# Patient Record
Sex: Female | Born: 1995 | Race: White | Hispanic: No | Marital: Single | Smoking: Never smoker
Health system: Southern US, Community
[De-identification: ages and names within clinical notes are randomized; demographics above are authoritative.]

## PROBLEM LIST (undated history)

## (undated) DIAGNOSIS — F419 Anxiety disorder, unspecified: Secondary | ICD-10-CM

## (undated) DIAGNOSIS — G4700 Insomnia, unspecified: Secondary | ICD-10-CM

## (undated) DIAGNOSIS — F319 Bipolar disorder, unspecified: Secondary | ICD-10-CM

## (undated) DIAGNOSIS — O009 Unspecified ectopic pregnancy without intrauterine pregnancy: Secondary | ICD-10-CM

## (undated) DIAGNOSIS — F32A Depression, unspecified: Secondary | ICD-10-CM

## (undated) DIAGNOSIS — J45909 Unspecified asthma, uncomplicated: Secondary | ICD-10-CM

## (undated) HISTORY — PX: OTHER SURGICAL HISTORY: SHX169

---

## 2019-03-28 ENCOUNTER — Other Ambulatory Visit: Payer: Self-pay

## 2019-03-28 ENCOUNTER — Emergency Department (HOSPITAL_COMMUNITY)
Admission: EM | Admit: 2019-03-28 | Discharge: 2019-03-28 | Disposition: A | Discharge status: Home / Self Care | Payer: Self-pay | Attending: Emergency Medicine | Admitting: Emergency Medicine

## 2019-03-28 ENCOUNTER — Encounter (HOSPITAL_COMMUNITY): Payer: Self-pay

## 2019-03-28 DIAGNOSIS — N390 Urinary tract infection, site not specified: Secondary | ICD-10-CM | POA: Insufficient documentation

## 2019-03-28 DIAGNOSIS — N939 Abnormal uterine and vaginal bleeding, unspecified: Secondary | ICD-10-CM | POA: Insufficient documentation

## 2019-03-28 HISTORY — DX: Unspecified ectopic pregnancy without intrauterine pregnancy: O00.90

## 2019-03-28 LAB — COMPREHENSIVE METABOLIC PANEL
ALT: 12 U/L (ref 0–44)
AST: 15 U/L (ref 15–41)
Albumin: 4.1 g/dL (ref 3.5–5.0)
Alkaline Phosphatase: 43 U/L (ref 38–126)
Anion gap: 6 (ref 5–15)
BUN: 7 mg/dL (ref 6–20)
CO2: 26 mmol/L (ref 22–32)
Calcium: 9.1 mg/dL (ref 8.9–10.3)
Chloride: 106 mmol/L (ref 98–111)
Creatinine, Ser: 0.79 mg/dL (ref 0.44–1.00)
GFR calc Af Amer: >60 mL/min (ref >60)
GFR calc non Af Amer: >60 mL/min (ref >60)
Glucose, Bld: 92 mg/dL (ref 70–99)
Potassium: 3.7 mmol/L (ref 3.5–5.1)
Sodium: 138 mmol/L (ref 135–145)
Total Bilirubin: 1.4 mg/dL — ABNORMAL HIGH (ref 0.3–1.2)
Total Protein: 6.3 g/dL — ABNORMAL LOW (ref 6.5–8.1)

## 2019-03-28 LAB — URINALYSIS, ROUTINE W REFLEX MICROSCOPIC
Bilirubin Urine: NEGATIVE
Glucose, UA: NEGATIVE mg/dL
Ketones, ur: 15 mg/dL — AB
Leukocytes,Ua: NEGATIVE
Nitrite: POSITIVE — AB
Protein, ur: 100 mg/dL — AB
Specific Gravity, Urine: >1.03 — ABNORMAL HIGH (ref 1.005–1.030)
pH: 5 (ref 5.0–8.0)

## 2019-03-28 LAB — I-STAT BETA HCG BLOOD, ED (MC, WL, AP ONLY): I-stat hCG, quantitative: <5 m[IU]/mL (ref <5)

## 2019-03-28 LAB — WET PREP, GENITAL
Sperm: NONE SEEN
Trich, Wet Prep: NONE SEEN
WBC, Wet Prep HPF POC: NONE SEEN
Yeast Wet Prep HPF POC: NONE SEEN

## 2019-03-28 LAB — URINALYSIS, MICROSCOPIC (REFLEX): RBC / HPF: >50 RBC/hpf (ref 0–5)

## 2019-03-28 LAB — CBC
HCT: 39 % (ref 36.0–46.0)
Hemoglobin: 13.3 g/dL (ref 12.0–15.0)
MCH: 34.2 pg — ABNORMAL HIGH (ref 26.0–34.0)
MCHC: 34.1 g/dL (ref 30.0–36.0)
MCV: 100.3 fL — ABNORMAL HIGH (ref 80.0–100.0)
Platelets: 249 10*3/uL (ref 150–400)
RBC: 3.89 MIL/uL (ref 3.87–5.11)
RDW: 11.2 % — ABNORMAL LOW (ref 11.5–15.5)
WBC: 4.9 10*3/uL (ref 4.0–10.5)
nRBC: 0 % (ref 0.0–0.2)

## 2019-03-28 LAB — HIV ANTIBODY (ROUTINE TESTING W REFLEX): HIV Screen 4th Generation wRfx: NONREACTIVE

## 2019-03-28 LAB — LIPASE, BLOOD: Lipase: 30 U/L (ref 11–51)

## 2019-03-28 MED ORDER — CEPHALEXIN 500 MG PO CAPS: ORAL_CAPSULE | ORAL | 0 refills | Status: DC

## 2019-03-28 NOTE — ED Notes (Signed)
Blood draw attempted for 3 gold tubes, vein blew after 1 full tube. 2 partial also sent before next site blew.

## 2019-03-28 NOTE — ED Triage Notes (Signed)
Pt arrives POV for eval of vag bleeding onset this AM. Reports she has taken 2 negative pregnancy tests but endorses new onset vag bleeding this AM in addition to a normal period this month. States she has a hx of ectopic pregnancy w/ tube rupture. States this does not feel similar but she "wants to get check out" Endorses mild nonspecific abd pain.

## 2019-03-28 NOTE — ED Provider Notes (Signed)
[facility] EMERGENCY DEPARTMENT Provider Note   CSN: [id number] Arrival date & time: 03/28/19  1206     History   Chief Complaint Chief Complaint  Patient presents with  . Vaginal Bleeding    HPI Janet Chen is a 23 y.o. female.     The history is provided by the patient. No language interpreter was used.     23 year old female with prior history of ectopic pregnancy presenting for evaluation of vaginal bleeding.  Patient report earlier this week she developed vaginal bleeding for approximately 3 days.  Bleeding was scant, and subsequently stopped however bleeding resumed again today.  She endorsed some mild lower abdominal cramping with the bleeding and was concerned for potential ectopic pregnancy.  States that she has had prior ectopic pregnancy.  She also took 2 pregnancy test of which are negative.  She does endorse new sexual partner, using protection.  She denies any prior history of STI.  She does not complain of any fever chills no nausea vomiting or diarrhea no dysuria or hematuria.  She report her pain is minimal, sharp, 5 out of 10, gradual and nonradiating.  Past Medical History:  Diagnosis Date  . Ectopic pregnancy     There are no active problems to display for this patient.   Past Surgical History:  Procedure Laterality Date  . Fallopian Tube Removal       OB History    Gravida  0   Para  0   Term  0   Preterm  0   AB  0   Living  0     SAB  0   TAB  0   Ectopic  0   Multiple  0   Live Births  0            Home Medications    Prior to Admission medications   Not on File    Family History No family history on file.  Social History Social History   Tobacco Use  . Smoking status: Never Smoker  . Smokeless tobacco: Never Used  Substance Use Topics  . Alcohol use: Not Currently  . Drug use: Not Currently     Allergies   Patient has no known allergies.   Review of Systems Review of  Systems  All other systems reviewed and are negative.    Physical Exam Updated Vital Signs BP 115/82 (BP Location: Left Arm)   Pulse 78   Temp 98.7 F (37.1 C) (Oral)   Resp 14   Ht 5\' 5"  (1.651 m)   Wt 57.6 kg   SpO2 100%   BMI 21.13 kg/m   Physical Exam Vitals signs and nursing note reviewed.  Constitutional:      General: She is not in acute distress.    Appearance: She is well-developed.  HENT:     Head: Atraumatic.  Eyes:     Conjunctiva/sclera: Conjunctivae normal.  Neck:     Musculoskeletal: Neck supple.  Cardiovascular:     Rate and Rhythm: Normal rate and regular rhythm.     Pulses: Normal pulses.     Heart sounds: Normal heart sounds.  Pulmonary:     Effort: Pulmonary effort is normal.     Breath sounds: Normal breath sounds.  Abdominal:     General: Abdomen is flat.     Palpations: Abdomen is soft.     Tenderness: There is no abdominal tenderness.  Genitourinary:    Comments: Chaperone present during exam.  No inguinal lymphadenopathy or inguinal hernia noted.  Normal external genitalia.  Small amount of blood noted in vaginal vault.  Cervical os is closed without any friable skin changes.  No obvious discharge noted.  On bimanual examination no adnexal tenderness or cervical motion tenderness. Skin:    Findings: No rash.  Neurological:     Mental Status: She is alert.      ED Treatments / Results  Labs (all labs ordered are listed, but only abnormal results are displayed) Labs Reviewed  WET PREP, GENITAL - Abnormal; Notable for the following components:      Result Value   Clue Cells Wet Prep HPF POC PRESENT (*)    All other components within normal limits  COMPREHENSIVE METABOLIC PANEL - Abnormal; Notable for the following components:   Total Protein 6.3 (*)    Total Bilirubin 1.4 (*)    All other components within normal limits  CBC - Abnormal; Notable for the following components:   MCV 100.3 (*)    MCH 34.2 (*)    RDW 11.2 (*)    All  other components within normal limits  URINALYSIS, ROUTINE W REFLEX MICROSCOPIC - Abnormal; Notable for the following components:   Color, Urine RED (*)    APPearance CLOUDY (*)    Specific Gravity, Urine >1.030 (*)    Hgb urine dipstick LARGE (*)    Ketones, ur 15 (*)    Protein, ur 100 (*)    Nitrite POSITIVE (*)    All other components within normal limits  URINALYSIS, MICROSCOPIC (REFLEX) - Abnormal; Notable for the following components:   Bacteria, UA MANY (*)    All other components within normal limits  LIPASE, BLOOD  RPR  HIV ANTIBODY (ROUTINE TESTING W REFLEX)  I-STAT BETA HCG BLOOD, ED ([facility], [facility], [facility] ONLY)  GC/CHLAMYDIA PROBE AMP ([place]) NOT AT [facility]    EKG None  Radiology No results found.  Procedures Procedures (including critical care time)  Medications Ordered in ED Medications - No data to display   Initial Impression / Assessment and Plan / ED Course  I have reviewed the triage vital signs and the nursing notes.  Pertinent labs & imaging results that were available during my care of the patient were reviewed by me and considered in my medical decision making (see chart for details).        BP 115/82 (BP Location: Left Arm)   Pulse 78   Temp 98.7 F (37.1 C) (Oral)   Resp 14   Ht 5\' 5"  (1.651 m)   Wt 57.6 kg   SpO2 100%   BMI 21.13 kg/m    Final Clinical Impressions(s) / ED Diagnoses   Final diagnoses:  Abnormal vaginal bleeding  Acute lower UTI    ED Discharge Orders    None     1:42 PM Patient with prior history of ectopic pregnancy and complication here with vaginal bleeding and concern for pregnancy.  She has a benign abdominal exam, pelvic semination unremarkable.  No evidence to suggest PID.  2:20 PM Pregnancy test is negative.  UA does show positive nitrite and many bacteria concerning for urinary tract infection.  Will treat with Keflex.  Presence of clue cells however patient does not have odor or vaginal irritation to  suggest BV.  The remainder of her labs are reassuring.  Will discharge home with antibiotic as treatment for urinary tract infection.  Return precaution discussed.   Fayrene Helperran, Cerissa Zeiger, PA-C 03/28/19 1504  Carmin Muskrat, MD 03/28/19 [phone]

## 2019-03-29 LAB — RPR: RPR Ser Ql: NONREACTIVE

## 2019-03-30 LAB — GC/CHLAMYDIA PROBE AMP (~~LOC~~) NOT AT ARMC
Chlamydia: NEGATIVE
Neisseria Gonorrhea: NEGATIVE

## 2019-08-25 ENCOUNTER — Other Ambulatory Visit: Payer: Self-pay

## 2019-08-25 ENCOUNTER — Emergency Department (HOSPITAL_COMMUNITY)
Admission: EM | Admit: 2019-08-25 | Discharge: 2019-08-26 | Disposition: A | Discharge status: Home / Self Care | Payer: BLUE CROSS/BLUE SHIELD | Attending: Emergency Medicine | Admitting: Emergency Medicine

## 2019-08-25 ENCOUNTER — Encounter (HOSPITAL_COMMUNITY): Payer: Self-pay | Admitting: *Deleted

## 2019-08-25 DIAGNOSIS — F329 Major depressive disorder, single episode, unspecified: Secondary | ICD-10-CM | POA: Insufficient documentation

## 2019-08-25 DIAGNOSIS — F419 Anxiety disorder, unspecified: Secondary | ICD-10-CM | POA: Insufficient documentation

## 2019-08-25 DIAGNOSIS — F32A Depression, unspecified: Secondary | ICD-10-CM

## 2019-08-25 DIAGNOSIS — Z046 Encounter for general psychiatric examination, requested by authority: Secondary | ICD-10-CM | POA: Diagnosis present

## 2019-08-25 NOTE — ED Triage Notes (Signed)
Pt reporting history of anxiety, depression and bipolar. Stating she has not had her medication in about 2 weeks (ran out). She says that she lives at the [facility], and thinks someone told them she was going to harm herself, and was told she needed to come here for evaluation. Pt denies saying she wanted to harm herself, she denies SI, HI.

## 2019-08-26 NOTE — ED Provider Notes (Signed)
[facility] DEPT Provider Note   CSN: [id number] Arrival date & time: 08/25/19  2238     History Chief Complaint  Patient presents with  . Medical Clearance    Janet Chen is a 24 y.o. female.  Patient to ED for evaluation of symptoms of depression and anxiety. She has a long history of same, previously on Abilify through [place]. She reports she obtained insurance and had to change psychology providers so ran out of her Abilify 2 weeks ago. She feels she is having typical symptoms for her. No SI/HI/AVH. She lives in an [place] whose supervisors were concerned about escalating symptoms and history of SI. The patient denies any current or recent suicidal thoughts. She is here to be compliant with [facility] recommendations for evaluation. She reports she has strong family support and regular contact with them. She has a scheduled appointment with psychiatry on May 4th for further management.   The history is provided by the patient. No language interpreter was used.       Past Medical History:  Diagnosis Date  . Ectopic pregnancy     There are no problems to display for this patient.   Past Surgical History:  Procedure Laterality Date  . Fallopian Tube Removal       OB History    Gravida  0   Para  0   Term  0   Preterm  0   AB  0   Living  0     SAB  0   TAB  0   Ectopic  0   Multiple  0   Live Births  0           No family history on file.  Social History   Tobacco Use  . Smoking status: Never Smoker  . Smokeless tobacco: Never Used  Substance Use Topics  . Alcohol use: Not Currently  . Drug use: Not Currently    Home Medications Prior to Admission medications   Medication Sig Start Date End Date Taking? Authorizing Provider  cephALEXin (KEFLEX) 500 MG capsule 2 caps po bid x 7 days Patient not taking: Reported on 08/25/2019 03/28/19   Domenic Moras, PA-C    Allergies    Patient has no known  allergies.  Review of Systems   Review of Systems  Constitutional: Negative for chills and fever.  HENT: Negative.   Respiratory: Negative.   Cardiovascular: Negative.   Gastrointestinal: Negative.   Musculoskeletal: Negative.   Skin: Negative.   Neurological: Negative.   Psychiatric/Behavioral: Positive for dysphoric mood. Negative for hallucinations, self-injury and suicidal ideas.    Physical Exam Updated Vital Signs BP (!) 154/95   Pulse 93   Temp 98.4 F (36.9 C) (Oral)   Resp 18   Ht 5\' 5"  (1.651 m)   Wt 58.1 kg   SpO2 100%   BMI 21.30 kg/m   Physical Exam Vitals and nursing note reviewed.  Constitutional:      Appearance: She is well-developed.  HENT:     Head: Normocephalic.  Cardiovascular:     Rate and Rhythm: Normal rate and regular rhythm.  Pulmonary:     Effort: Pulmonary effort is normal.     Breath sounds: Normal breath sounds.  Abdominal:     General: Bowel sounds are normal.     Palpations: Abdomen is soft.     Tenderness: There is no abdominal tenderness. There is no guarding or rebound.  Musculoskeletal:  General: Normal range of motion.     Cervical back: Normal range of motion and neck supple.  Skin:    General: Skin is warm and dry.     Findings: No rash.  Neurological:     Mental Status: She is alert and oriented to person, place, and time.  Psychiatric:        Attention and Perception: Attention and perception normal.        Mood and Affect: Mood normal.        Speech: Speech normal.        Behavior: Behavior normal. Behavior is cooperative.        Thought Content: Thought content does not include homicidal or suicidal ideation.     ED Results / Procedures / Treatments   Labs (all labs ordered are listed, but only abnormal results are displayed) Labs Reviewed - No data to display  EKG None  Radiology No results found.  Procedures Procedures (including critical care time)  Medications Ordered in ED Medications -  No data to display  ED Course  I have reviewed the triage vital signs and the nursing notes.  Pertinent labs & imaging results that were available during my care of the patient were reviewed by me and considered in my medical decision making (see chart for details).    MDM Rules/Calculators/A&P                      The patient is here at the recommendation of her [facility] supervisor out of concern for sxs of depression (quiet, isolating) and anxiety. The patient denies suicidal ideation, now or in the recent past. No HI/AVH. She denies substance use and is compliant with drug testing per [facility] rules.   She is alert, oriented and cooperative here. She appears to have willingness to comply with requirements of her treatment, seeking care, maintaining family contact. She is not felt a danger to herself or others.   She can be discharged home. She knows she is welcome to return at any time if needed.   Final Clinical Impression(s) / ED Diagnoses Final diagnoses:  None   1. Depresison, mild 2. Anxiety  Rx / DC Orders ED Discharge Orders    None       Danne Harbor 08/26/19 0153    Zadie Rhine, MD 08/26/19 [phone]

## 2019-08-26 NOTE — Discharge Instructions (Addendum)
Keep your scheduled appointment with psychiatry on May 4. Return here as needed if symptoms worsen. Good luck with your treatment!

## 2020-07-01 ENCOUNTER — Encounter (HOSPITAL_COMMUNITY): Payer: Self-pay

## 2020-07-01 ENCOUNTER — Emergency Department (HOSPITAL_COMMUNITY)
Admission: EM | Admit: 2020-07-01 | Discharge: 2020-07-01 | Disposition: A | Discharge status: Home / Self Care | Payer: BLUE CROSS/BLUE SHIELD | Attending: Emergency Medicine | Admitting: Emergency Medicine

## 2020-07-01 ENCOUNTER — Other Ambulatory Visit: Payer: Self-pay

## 2020-07-01 DIAGNOSIS — R110 Nausea: Secondary | ICD-10-CM | POA: Diagnosis not present

## 2020-07-01 DIAGNOSIS — R197 Diarrhea, unspecified: Secondary | ICD-10-CM | POA: Diagnosis not present

## 2020-07-01 LAB — CBC
HCT: 41.4 % (ref 36.0–46.0)
Hemoglobin: 14.7 g/dL (ref 12.0–15.0)
MCH: 34.8 pg — ABNORMAL HIGH (ref 26.0–34.0)
MCHC: 35.5 g/dL (ref 30.0–36.0)
MCV: 97.9 fL (ref 80.0–100.0)
Platelets: 228 10*3/uL (ref 150–400)
RBC: 4.23 MIL/uL (ref 3.87–5.11)
RDW: 11.8 % (ref 11.5–15.5)
WBC: 14.8 10*3/uL — ABNORMAL HIGH (ref 4.0–10.5)
nRBC: 0 % (ref 0.0–0.2)

## 2020-07-01 LAB — LIPASE, BLOOD: Lipase: 56 U/L — ABNORMAL HIGH (ref 11–51)

## 2020-07-01 LAB — COMPREHENSIVE METABOLIC PANEL
ALT: 12 U/L (ref 0–44)
AST: 19 U/L (ref 15–41)
Albumin: 4.3 g/dL (ref 3.5–5.0)
Alkaline Phosphatase: 69 U/L (ref 38–126)
Anion gap: 12 (ref 5–15)
BUN: 10 mg/dL (ref 6–20)
CO2: 20 mmol/L — ABNORMAL LOW (ref 22–32)
Calcium: 9.1 mg/dL (ref 8.9–10.3)
Chloride: 101 mmol/L (ref 98–111)
Creatinine, Ser: 0.85 mg/dL (ref 0.44–1.00)
GFR, Estimated: >60 mL/min (ref >60)
Glucose, Bld: 94 mg/dL (ref 70–99)
Potassium: 3 mmol/L — ABNORMAL LOW (ref 3.5–5.1)
Sodium: 133 mmol/L — ABNORMAL LOW (ref 135–145)
Total Bilirubin: 0.8 mg/dL (ref 0.3–1.2)
Total Protein: 7.8 g/dL (ref 6.5–8.1)

## 2020-07-01 LAB — I-STAT BETA HCG BLOOD, ED (MC, WL, AP ONLY): I-stat hCG, quantitative: <5 m[IU]/mL (ref <5)

## 2020-07-01 MED ORDER — SODIUM CHLORIDE 0.9 % IV BOLUS
1000.0000 mL | Freq: Once | INTRAVENOUS | Status: AC
  Administered 2020-07-01: 1000 mL via INTRAVENOUS

## 2020-07-01 MED ORDER — FAMOTIDINE IN NACL 20-0.9 MG/50ML-% IV SOLN
20.0000 mg | Freq: Once | INTRAVENOUS | Status: AC
  Administered 2020-07-01: 20 mg via INTRAVENOUS
  Filled 2020-07-01: qty 50

## 2020-07-01 MED ORDER — ONDANSETRON HCL 4 MG/2ML IJ SOLN
4.0000 mg | Freq: Once | INTRAMUSCULAR | Status: AC
  Administered 2020-07-01: 4 mg via INTRAVENOUS
  Filled 2020-07-01: qty 2

## 2020-07-01 MED ORDER — ONDANSETRON HCL 4 MG PO TABS: 4.0000 mg | ORAL_TABLET | Freq: Four times a day (QID) | ORAL | 0 refills | Status: DC

## 2020-07-01 MED ORDER — KETOROLAC TROMETHAMINE 30 MG/ML IJ SOLN
30.0000 mg | Freq: Once | INTRAMUSCULAR | Status: AC
  Administered 2020-07-01: 30 mg via INTRAVENOUS
  Filled 2020-07-01: qty 1

## 2020-07-01 MED ORDER — POTASSIUM CHLORIDE CRYS ER 20 MEQ PO TBCR
40.0000 meq | EXTENDED_RELEASE_TABLET | Freq: Once | ORAL | Status: AC
  Administered 2020-07-01: 40 meq via ORAL
  Filled 2020-07-01: qty 2

## 2020-07-01 NOTE — ED Notes (Signed)
Patient provided with gingerale.

## 2020-07-01 NOTE — ED Provider Notes (Signed)
[facility]-EMERGENCY DEPT Provider Note   CSN: [id number] Arrival date & time: 07/01/20  1834     History Chief Complaint  Patient presents with  . Diarrhea  . Nausea    Janet Chen is a 25 y.o. female.  HPI   25 year old female past medical history of ectopic pregnancy currently on Nexplanon presents emergency department with concern for nausea and diarrhea.  Patient states that this started yesterday after eating seafood.  Her partner who ate with her did not get sick.  Shortly after dinner she developed generalized abdominal cramping, nausea and "profuse" diarrhea.  The diarrhea is nonbloody.  She has had no documented fever but endorses hot flashes/chills.  Denies any genitourinary symptoms.  No recent sick contact/traveling.  Past Medical History:  Diagnosis Date  . Ectopic pregnancy     There are no problems to display for this patient.   Past Surgical History:  Procedure Laterality Date  . Fallopian Tube Removal       OB History    Gravida  0   Para  0   Term  0   Preterm  0   AB  0   Living  0     SAB  0   IAB  0   Ectopic  0   Multiple  0   Live Births  0           History reviewed. No pertinent family history.  Social History   Tobacco Use  . Smoking status: Never Smoker  . Smokeless tobacco: Never Used  Substance Use Topics  . Alcohol use: Not Currently  . Drug use: Not Currently    Home Medications Prior to Admission medications   Medication Sig Start Date End Date Taking? Authorizing Provider  cephALEXin (KEFLEX) 500 MG capsule 2 caps po bid x 7 days Patient not taking: Reported on 08/25/2019 03/28/19   Fayrene Helper, PA-C    Allergies    Patient has no known allergies.  Review of Systems   Review of Systems  Constitutional: Positive for appetite change, chills and fatigue. Negative for fever.  HENT: Negative for congestion.   Eyes: Negative for visual disturbance.  Respiratory: Negative for  shortness of breath.   Cardiovascular: Negative for chest pain.  Gastrointestinal: Positive for abdominal pain, diarrhea and nausea. Negative for abdominal distention, anal bleeding, blood in stool and vomiting.  Genitourinary: Negative for dysuria, hematuria and vaginal bleeding.  Skin: Negative for rash.  Neurological: Negative for headaches.    Physical Exam Updated Vital Signs BP (!) 135/96 (BP Location: Right Arm)   Pulse (!) 115   Temp 98.8 F (37.1 C) (Oral)   Resp 18   SpO2 96%   Physical Exam Vitals and nursing note reviewed.  Constitutional:      Appearance: Normal appearance.  HENT:     Head: Normocephalic.     Mouth/Throat:     Mouth: Mucous membranes are moist.  Cardiovascular:     Rate and Rhythm: Normal rate.  Pulmonary:     Effort: Pulmonary effort is normal. No respiratory distress.  Abdominal:     General: There is no distension.     Palpations: Abdomen is soft.     Tenderness: There is no abdominal tenderness. There is no guarding.     Hernia: No hernia is present.  Skin:    General: Skin is warm.  Neurological:     Mental Status: She is alert and oriented to person, place, and time.  Mental status is at baseline.  Psychiatric:        Mood and Affect: Mood normal.     ED Results / Procedures / Treatments   Labs (all labs ordered are listed, but only abnormal results are displayed) Labs Reviewed  LIPASE, BLOOD - Abnormal; Notable for the following components:      Result Value   Lipase 56 (*)    All other components within normal limits  COMPREHENSIVE METABOLIC PANEL - Abnormal; Notable for the following components:   Sodium 133 (*)    Potassium 3.0 (*)    CO2 20 (*)    All other components within normal limits  CBC - Abnormal; Notable for the following components:   WBC 14.8 (*)    MCH 34.8 (*)    All other components within normal limits  URINALYSIS, ROUTINE W REFLEX MICROSCOPIC  I-STAT BETA HCG BLOOD, ED ([facility], [facility], [facility] ONLY)     EKG None  Radiology No results found.  Procedures Procedures   Medications Ordered in ED Medications  sodium chloride 0.9 % bolus 1,000 mL (has no administration in time range)  ketorolac (TORADOL) 30 MG/ML injection 30 mg (has no administration in time range)  famotidine (PEPCID) IVPB 20 mg premix (has no administration in time range)    ED Course  I have reviewed the triage vital signs and the nursing notes.  Pertinent labs & imaging results that were available during my care of the patient were reviewed by me and considered in my medical decision making (see chart for details).    MDM Rules/Calculators/A&P                          Patient is afebrile, vitals are stable.  She is laying in bed, well-appearing, reassuring and benign abdominal exam.  Blood work shows a mild hypokalemia, leukocytosis that is most likely reactive, just barely elevated lipase.  Low suspicion for acute pancreatitis.  In the setting that this would be pancreatitis she is a low Ranson score.  Patient was treated with medication and her symptoms have improved.  She is been able to p.o. and drink ginger ale.  Potassium was replaced.  Plan for outpatient follow-up, return precautions discussed.  With normal vitals, patient being able to p.o., reassuring blood work and benign abdominal exam I do not feel an emergent CT is warranted at this time.  Patient is motivated and prefers to go home.  Patient will be discharged and treated as an outpatient.  Discharge plan and strict return to ED precautions discussed, patient verbalizes understanding and agreement.  Final Clinical Impression(s) / ED Diagnoses Final diagnoses:  None    Rx / DC Orders ED Discharge Orders    None       Rozelle Logan, DO 07/01/20 2254

## 2020-07-01 NOTE — ED Notes (Signed)
Labeled specimen cup given to pt for urine collection per MD order. ENMiles 

## 2020-07-01 NOTE — ED Triage Notes (Signed)
Pt presents with c/o nausea and diarrhea that started yesterday. Pt concerned that she has food poisoning.

## 2020-07-01 NOTE — Discharge Instructions (Signed)
You have been seen and discharged from the emergency department.  Your potassium was slightly low, you were given replacement in the department.  Take nausea medicine as needed, stay well-hydrated.  Start with a full liquid diet and advance as tolerated.  Follow-up with your primary provider for reevaluation. Take home medications as prescribed. If you have any worsening symptoms, severe abdominal pain, high fevers or further concerns for health please return to an emergency department for further evaluation.

## 2021-02-15 ENCOUNTER — Emergency Department (HOSPITAL_COMMUNITY)
Admission: EM | Admit: 2021-02-15 | Discharge: 2021-02-15 | Disposition: A | Discharge status: Home / Self Care | Payer: BLUE CROSS/BLUE SHIELD | Attending: Emergency Medicine | Admitting: Emergency Medicine

## 2021-02-15 ENCOUNTER — Encounter (HOSPITAL_COMMUNITY): Payer: Self-pay

## 2021-02-15 ENCOUNTER — Other Ambulatory Visit: Payer: Self-pay

## 2021-02-15 ENCOUNTER — Emergency Department (HOSPITAL_COMMUNITY): Payer: BLUE CROSS/BLUE SHIELD

## 2021-02-15 DIAGNOSIS — S0101XA Laceration without foreign body of scalp, initial encounter: Secondary | ICD-10-CM | POA: Diagnosis present

## 2021-02-15 DIAGNOSIS — Y93I9 Activity, other involving external motion: Secondary | ICD-10-CM | POA: Diagnosis not present

## 2021-02-15 DIAGNOSIS — S060X9A Concussion with loss of consciousness of unspecified duration, initial encounter: Secondary | ICD-10-CM | POA: Diagnosis not present

## 2021-02-15 MED ORDER — LIDOCAINE-EPINEPHRINE (PF) 2 %-1:200000 IJ SOLN
20.0000 mL | Freq: Once | INTRAMUSCULAR | Status: AC
  Administered 2021-02-15: 20 mL
  Filled 2021-02-15: qty 20

## 2021-02-15 NOTE — ED Notes (Signed)
Patients wound was irrigated with wound cleaner and normal saline.
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
## 2021-05-13 ENCOUNTER — Emergency Department (HOSPITAL_COMMUNITY): Payer: BLUE CROSS/BLUE SHIELD

## 2021-05-13 ENCOUNTER — Encounter (HOSPITAL_COMMUNITY): Payer: Self-pay | Admitting: Emergency Medicine

## 2021-05-13 ENCOUNTER — Emergency Department (HOSPITAL_COMMUNITY)
Admission: EM | Admit: 2021-05-13 | Discharge: 2021-05-13 | Disposition: A | Discharge status: Home / Self Care | Payer: BLUE CROSS/BLUE SHIELD | Attending: Emergency Medicine | Admitting: Emergency Medicine

## 2021-05-13 DIAGNOSIS — M25562 Pain in left knee: Secondary | ICD-10-CM | POA: Insufficient documentation

## 2021-05-13 DIAGNOSIS — J45909 Unspecified asthma, uncomplicated: Secondary | ICD-10-CM | POA: Insufficient documentation

## 2021-05-13 DIAGNOSIS — Z79899 Other long term (current) drug therapy: Secondary | ICD-10-CM | POA: Insufficient documentation

## 2021-05-13 DIAGNOSIS — X501XXA Overexertion from prolonged static or awkward postures, initial encounter: Secondary | ICD-10-CM | POA: Insufficient documentation

## 2021-05-13 DIAGNOSIS — Y9339 Activity, other involving climbing, rappelling and jumping off: Secondary | ICD-10-CM | POA: Insufficient documentation

## 2021-05-13 MED ORDER — KETOROLAC TROMETHAMINE 15 MG/ML IJ SOLN
15.0000 mg | Freq: Once | INTRAMUSCULAR | Status: AC
  Administered 2021-05-13: 15 mg via INTRAMUSCULAR
  Filled 2021-05-13: qty 1

## 2021-05-13 NOTE — ED Triage Notes (Addendum)
Patient c/o L knee pain after twisting it while jumping last night. Reports swelling to knee. Pain worsens with movement. Ambulatory.

## 2021-05-13 NOTE — Discharge Instructions (Addendum)
You came to the emergency department today to be evaluated for your left knee injury.  Your x-ray showed no broken bones or dislocations.  Did show a significant amount of swelling.  Due to your swelling and pain on exam you are placed in a knee immobilizer and given crutches.  Please continue to use the knee immobilizer and crutches until you can follow-up with an orthopedic provider.  Please take Ibuprofen (Advil, motrin) and Tylenol (acetaminophen) to relieve your pain.    You may take up to 600 MG (3 pills) of normal strength ibuprofen every 8 hours as needed.   You make take tylenol, up to 1,000 mg (two extra strength pills) every 8 hours as needed.   It is safe to take ibuprofen and tylenol at the same time as they work differently.   Do not take more than 3,000 mg tylenol in a 24 hour period (not more than one dose every 8 hours.  Please check all medication labels as many medications such as pain and cold medications may contain tylenol.  Do not drink alcohol while taking these medications.  Do not take other NSAID'S while taking ibuprofen (such as aleve or naproxen).  Please take ibuprofen with food to decrease stomach upset.   Get help right away if: Your knee swells, and the swelling becomes worse. You cannot move your knee. You have severe pain in your knee that cannot be managed with pain medicine.

## 2021-05-13 NOTE — ED Provider Notes (Signed)
[facility] DEPT Provider Note   CSN: [id number] Arrival date & time: 05/13/21  [id number]     History  Chief Complaint  Patient presents with   Knee Pain    Janet Chen is a 26 y.o. female with past medical history of bipolar disorder, asthma.  Presents to the emergency department with complaint of left knee pain.  Patient states that she injured her self at approximately 1600 this morning.  Patient states that she jumped on a friend and somehow injured her left knee.  Patient is unsure the mechanism of injury.  Reports pain has been constant since 2 AM.  Patient rates pain 9/10 on the pain scale.  Pain is worse with movement, touch, and weightbearing.  Patient has not tried any modalities to alleviate her symptoms.  Patient denies any numbness, weakness, color change, wounds, rash, neck pain, back pain.     Knee Pain Associated symptoms: no back pain, no fever and no neck pain       Home Medications Prior to Admission medications   Medication Sig Start Date End Date Taking? Authorizing Provider  albuterol (VENTOLIN HFA) 108 (90 Base) MCG/ACT inhaler Inhale 2 puffs into the lungs every 6 (six) hours as needed for wheezing. 03/04/20   [provider]  busPIRone (BUSPAR) 10 MG tablet Take 10 mg by mouth 2 (two) times daily. 06/28/20   [provider]  etonogestrel (NEXPLANON) 68 MG IMPL implant 1 each by Subdermal route continuous.    [provider]  FLUoxetine (PROZAC) 20 MG capsule Take 20 mg by mouth daily. Takes with 40 mg tablet to total 60 mg 06/28/20   [provider]  FLUoxetine (PROZAC) 40 MG capsule Take 40 mg by mouth daily. Take with 20 mg to total 60 mg 06/28/20   [provider]  ondansetron (ZOFRAN) 4 MG tablet Take 1 tablet (4 mg total) by mouth every 6 (six) hours. Patient not taking: Reported on 02/15/2021 07/01/20   Horton, Drue Dun M, DO  QUEtiapine (SEROQUEL) 400 MG tablet Take 400 mg by mouth  at bedtime. 06/20/20   [provider]      Allergies    Lactose intolerance (gi)    Review of Systems   Review of Systems  Constitutional:  Negative for chills and fever.  HENT:  Negative for facial swelling.   Eyes:  Negative for visual disturbance.  Respiratory:  Negative for shortness of breath.   Cardiovascular:  Negative for chest pain.  Gastrointestinal:  Negative for abdominal pain, nausea and vomiting.  Genitourinary:  Negative for difficulty urinating.  Musculoskeletal:  Positive for arthralgias and joint swelling. Negative for back pain and neck pain.  Skin:  Negative for color change and rash.  Neurological:  Negative for dizziness, syncope, weakness, light-headedness, numbness and headaches.  Psychiatric/Behavioral:  Negative for confusion.    Physical Exam Updated Vital Signs BP 124/80    Pulse (!) 106    Temp 98.8 F (37.1 C)    Resp 18    SpO2 96%  Physical Exam Vitals and nursing note reviewed.  Constitutional:      General: She is not in acute distress.    Appearance: She is not ill-appearing, toxic-appearing or diaphoretic.  HENT:     Head: Normocephalic.  Eyes:     General: No scleral icterus.       Right eye: No discharge.        Left eye: No discharge.  Cardiovascular:  Rate and Rhythm: Normal rate.     Pulses:          Dorsalis pedis pulses are 2+ on the right side and 2+ on the left side.  Pulmonary:     Effort: Pulmonary effort is normal.  Musculoskeletal:     Right knee: No swelling, deformity, effusion, erythema, ecchymosis, lacerations, bony tenderness or crepitus. Normal range of motion. No tenderness. Normal alignment.     Left knee: Swelling present. No deformity, erythema, ecchymosis, lacerations, bony tenderness or crepitus. Decreased range of motion. Tenderness present over the medial joint line. Normal alignment.     Right lower leg: Normal.     Left lower leg: Normal.     Right ankle: No swelling, deformity, ecchymosis or  lacerations. No tenderness. Normal range of motion.     Left ankle: No swelling, deformity, ecchymosis or lacerations. No tenderness. Normal range of motion.     Right foot: Normal range of motion and normal capillary refill. No swelling, deformity, laceration, tenderness, bony tenderness or crepitus.     Left foot: Normal range of motion and normal capillary refill. No swelling, deformity, laceration, tenderness, bony tenderness or crepitus.  Feet:     Right foot:     Skin integrity: Skin integrity normal.     Toenail Condition: Right toenails are normal.     Left foot:     Skin integrity: Skin integrity normal.     Toenail Condition: Left toenails are normal.  Skin:    General: Skin is warm and dry.  Neurological:     General: No focal deficit present.     Mental Status: She is alert.  Psychiatric:        Behavior: Behavior is cooperative.    ED Results / Procedures / Treatments   Labs (all labs ordered are listed, but only abnormal results are displayed) Labs Reviewed - No data to display  EKG None  Radiology DG Knee Complete 4 Views Left  Result Date: 05/13/2021 CLINICAL DATA:  Left knee pain after twisting injury last night EXAM: LEFT KNEE - COMPLETE 4+ VIEW COMPARISON:  None. FINDINGS: Joint effusion without acute fracture or subluxation. No degenerative changes or visible bone lesion IMPRESSION: Joint effusion without fracture. Electronically Signed   By: Jorje Guild M.D.   On: 05/13/2021 10:21    Procedures Procedures    Medications Ordered in ED Medications  ketorolac (TORADOL) 15 MG/ML injection 15 mg (15 mg Intramuscular Given 05/13/21 1152)    ED Course/ Medical Decision Making/ A&P                           Medical Decision Making  Alert 26 year old female no acute distress, nontoxic-appearing.  Presents emergency department with chief complaint of left knee pain.  Patient reports that pain started after injuring herself at 0 200 this morning.  Patient  does not know mechanism of injury.  On physical exam patient has swelling to left knee with decreased range of motion secondary to complaints of pain.  Pain to medial joint line.  Pulse, motor, and sensation intact distally.  X-ray imaging was ordered, I personally reviewed this imaging and agree with radiology.  Imaging shows no acute osseous abnormality, joint effusion.  Patient denies any history of CKD, blood thinner use, or pregnancy. Discussed that if patient was pregnant could cause damage to developing fetus.  Patient understands this and is agreeable to restart all at this time without pregnancy testing.  We will plan to give patient Toradol injection at this time.    Due to patient's swelling, tenderness and decreased range of motion concern for possible ligamentous injury.  Will place patient in knee immobilizer and make her nonweightbearing with crutches.  Patient to remain nonweightbearing until she can follow-up with orthopedic provider in outpatient setting.  Discussed symptomatic treatment with patient.  Discussed results, findings, treatment and follow up. Patient advised of return precautions. Patient verbalized understanding and agreed with plan.           Final Clinical Impression(s) / ED Diagnoses Final diagnoses:  Acute pain of left knee    Rx / DC Orders ED Discharge Orders     None         Loni Beckwith, PA-C 05/13/21 1211    Lucrezia Starch, MD 05/14/21 [phone]

## 2021-05-15 ENCOUNTER — Ambulatory Visit (INDEPENDENT_AMBULATORY_CARE_PROVIDER_SITE_OTHER): Payer: BLUE CROSS/BLUE SHIELD | Admitting: Orthopedic Surgery

## 2021-05-15 ENCOUNTER — Other Ambulatory Visit: Payer: Self-pay

## 2021-05-15 ENCOUNTER — Telehealth: Payer: Self-pay | Admitting: Orthopedic Surgery

## 2021-05-15 DIAGNOSIS — M25562 Pain in left knee: Secondary | ICD-10-CM

## 2021-05-15 NOTE — Telephone Encounter (Signed)
Okay to walk around in knee immobilizer.  Should be fine.

## 2021-05-15 NOTE — Telephone Encounter (Signed)
Pt called and have some medical questions about her visit today. Please call pt at [phone].

## 2021-05-15 NOTE — Telephone Encounter (Signed)
Pt went to ED on 05/13/21 for acute injury to her left knee. She is in a knee brace. She was put on Dr. Diamantina Providence schedule this afternoon at 2:15 pm.

## 2021-05-16 ENCOUNTER — Telehealth: Payer: Self-pay | Admitting: Orthopaedic Surgery

## 2021-05-16 NOTE — Telephone Encounter (Signed)
Note written

## 2021-05-16 NOTE — Telephone Encounter (Signed)
Pt called wondering if she can get another work note stating that she needs to "take it easy" and not that she can't work at all. (Which is how her work is taking the first note). 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    cB [phone]

## 2021-05-16 NOTE — Telephone Encounter (Signed)
Note written Patient aware this is in her MyChart

## 2021-05-17 ENCOUNTER — Encounter: Payer: Self-pay | Admitting: Orthopedic Surgery

## 2021-05-17 NOTE — Progress Notes (Signed)
Office Visit Note   Patient: Janet Chen           Date of Birth: December 10, 1995           MRN: [id number] Visit Date: 05/15/2021 Requested by: Virgilio Belling, PA-C [id number] [place],  [place] [address] PCP: Chyrel Masson  Subjective: Chief Complaint  Patient presents with   Left Knee - Injury    HPI: Janet Chen is a 26 year old patient with left knee pain.  She sustained a twisting injury to her left knee on 05/12/2021.  She works as a Merchandiser, retail at [employer].  It has been hard for her to weight-bear but she has been getting around in a knee immobilizer.  Does not use it when she sleeping.  Reports global pain around the knee.  She lives by herself.  No personal or family history of DVT or pulmonary embolism.  She is not a smoker.  She lives in a 1 level house with 4 steps up.              ROS: All systems reviewed are negative as they relate to the chief complaint within the history of present illness.  Patient denies  fevers or chills.   Assessment & Plan: Visit Diagnoses:  1. Left knee pain, unspecified chronicity     Plan: Impression is left knee effusion with no fracture on plain radiographs.  Knee feels stable today.  This could be sequelae of patellar dislocation or could be a meniscal tear.  Less likely would be chondral defect/damage.  Nonetheless she does have difficulty weightbearing with moderate effusion in the knee.  Plan MRI left knee to evaluate for patellofemoral instability versus meniscal tear versus chondral damage.  Okay for sedentary duty and okay to ambulate in the knee immobilizer if needed weightbearing as tolerated.  We will see her back after that study.  Follow-Up Instructions: Return for after MRI.   Orders:  Orders Placed This Encounter  Procedures   MR Knee Left w/o contrast   No orders of the defined types were placed in this encounter.     Procedures: No procedures performed   Clinical Data: No additional  findings.  Objective: Vital Signs: There were no vitals taken for this visit.  Physical Exam:   Constitutional: Patient appears well-developed HEENT:  Head: Normocephalic Eyes:EOM are normal Neck: Normal range of motion Cardiovascular: Normal rate Pulmonary/chest: Effort normal Neurologic: Patient is alert Skin: Skin is warm Psychiatric: Patient has normal mood and affect   Ortho Exam: Ortho exam demonstrates moderate effusion in the left knee.  Extensor mechanism is intact.  Mild patellar apprehension.  Feels like she does have a decent endpoint on ACL examination but it is somewhat guarded exam.  Collaterals are stable to varus valgus stress at 0 and 30 degrees.  Pedal pulses palpable.  Not too much in the way of focal joint line tenderness.  No block to full extension but she only flexes to about 90 degrees primarily due to the effusion.  Statistically speaking this could be an ACL partial versus complete injury versus patellar instability.  She does have some apprehension on the left compared to the right with some medial patellar facet tenderness and medial epicondyle tenderness.  Specialty Comments:  No specialty comments available.  Imaging: No results found.   PMFS History: There are no problems to display for this patient.  Past Medical History:  Diagnosis Date   Ectopic pregnancy  History reviewed. No pertinent family history.  Past Surgical History:  Procedure Laterality Date   Fallopian Tube Removal     Social History   Occupational History   Not on file  Tobacco Use   Smoking status: Never   Smokeless tobacco: Never  Substance and Sexual Activity   Alcohol use: Not Currently   Drug use: Not Currently   Sexual activity: Not on file

## 2021-05-21 ENCOUNTER — Other Ambulatory Visit: Payer: Self-pay

## 2021-05-21 ENCOUNTER — Ambulatory Visit
Admission: RE | Admit: 2021-05-21 | Discharge: 2021-05-21 | Disposition: A | Discharge status: Home / Self Care | Payer: BLUE CROSS/BLUE SHIELD | Source: Ambulatory Visit | Attending: Orthopedic Surgery | Admitting: Orthopedic Surgery

## 2021-05-21 DIAGNOSIS — M25562 Pain in left knee: Secondary | ICD-10-CM

## 2021-05-22 ENCOUNTER — Encounter: Payer: Self-pay | Admitting: Orthopedic Surgery

## 2021-05-22 ENCOUNTER — Other Ambulatory Visit: Payer: Self-pay | Admitting: Surgical

## 2021-05-22 NOTE — Telephone Encounter (Signed)
I called.  She is convinced to be weightbearing as tolerated in the knee immobilizer for the next 2 weeks.  Come out of the knee immobilizer in 2 weeks and we will put her in a patellar stabilizing brace for the next 3 to 4 weeks after that and continue with weightbearing.  Please have her come in in 2 weeks.  Thanks

## 2021-05-23 ENCOUNTER — Ambulatory Visit: Payer: BLUE CROSS/BLUE SHIELD | Admitting: Orthopaedic Surgery

## 2021-05-31 ENCOUNTER — Encounter: Payer: Self-pay | Admitting: Orthopedic Surgery

## 2022-06-06 ENCOUNTER — Encounter: Payer: Self-pay | Admitting: Emergency Medicine

## 2022-06-06 ENCOUNTER — Ambulatory Visit
Admission: EM | Admit: 2022-06-06 | Discharge: 2022-06-06 | Disposition: A | Discharge status: Home / Self Care | Payer: BC Managed Care – PPO | Attending: Physician Assistant | Admitting: Physician Assistant

## 2022-06-06 ENCOUNTER — Ambulatory Visit (INDEPENDENT_AMBULATORY_CARE_PROVIDER_SITE_OTHER): Payer: BC Managed Care – PPO

## 2022-06-06 DIAGNOSIS — M25462 Effusion, left knee: Secondary | ICD-10-CM | POA: Diagnosis not present

## 2022-06-06 DIAGNOSIS — S8992XA Unspecified injury of left lower leg, initial encounter: Secondary | ICD-10-CM | POA: Diagnosis not present

## 2022-06-06 DIAGNOSIS — M25562 Pain in left knee: Secondary | ICD-10-CM | POA: Diagnosis not present

## 2022-06-06 NOTE — ED Triage Notes (Signed)
Pt c/o left knee pain. Started today. She states she has injured her knee in the past. She states she jumped off something at work and it felt like her knee shifted and returned to its normal position. She has not taken anything for the pain.

## 2022-06-06 NOTE — Discharge Instructions (Addendum)
SPRAIN: Stressed avoiding painful activities . Reviewed RICE guidelines. Use medications as directed, including NSAIDs. If no NSAIDs have been prescribed for you today, you may take Aleve or Motrin over the counter. May use Tylenol in between doses of NSAIDs.  If no improvement in the next 1-2 weeks, f/u with PCP or return to our office for reexamination, and please feel free to call or return at any time for any questions or concerns you may have and we will be happy to help you!      You have a condition requiring you to follow up with Orthopedics so please call one of the following office for appointment:   [facility] [address], [place], [place] [address] Phone: [phone]  [facility] [address], [place], [place] [address] Phone: [phone]  

## 2022-06-06 NOTE — ED Provider Notes (Signed)
[facility]    CSN: [id number] Arrival date & time: 06/06/22  1359      History   Chief Complaint Chief Complaint  Patient presents with   Knee Pain    left    HPI Janet Chen is a 27 y.o. female presenting for left knee pain.  Patient reports that she was off work and jumped off of a truck tire onto the ground.  Reports that she felt like her knee dislocated and then went back into place.  She says she has been unable to put direct pressure on the affected leg.  Denies weakness.  Reports some swelling of the medial aspect of the knee.  Denies any lacerations, abrasions or bruising.  Has not taken anything for pain relief.  Reports that last year she believes she dislocated her knee and was put into a brace and given crutches and states that it eventually got better on its own.  No other injuries sustained and no other complaints.  Additionally, patient states that she is not looking to file Workmen's Compensation at this time.  HPI  Past Medical History:  Diagnosis Date   Ectopic pregnancy     There are no problems to display for this patient.   Past Surgical History:  Procedure Laterality Date   Fallopian Tube Removal      OB History     Gravida  0   Para  0   Term  0   Preterm  0   AB  0   Living  0      SAB  0   IAB  0   Ectopic  0   Multiple  0   Live Births  0            Home Medications    Prior to Admission medications   Medication Sig Start Date End Date Taking? Authorizing Provider  busPIRone (BUSPAR) 10 MG tablet Take 10 mg by mouth 2 (two) times daily. 06/28/20  Yes [provider]  etonogestrel (NEXPLANON) 68 MG IMPL implant 1 each by Subdermal route continuous.   Yes [provider]  FLUoxetine (PROZAC) 20 MG capsule Take 20 mg by mouth daily. Takes with 40 mg tablet to total 60 mg 06/28/20  Yes [provider]  FLUoxetine (PROZAC) 40 MG capsule Take 40 mg by mouth daily. Take with 20 mg  to total 60 mg 06/28/20  Yes [provider]  QUEtiapine (SEROQUEL) 400 MG tablet Take 400 mg by mouth at bedtime. 06/20/20  Yes [provider]  albuterol (VENTOLIN HFA) 108 (90 Base) MCG/ACT inhaler Inhale 2 puffs into the lungs every 6 (six) hours as needed for wheezing. 03/04/20   [provider]  ondansetron (ZOFRAN) 4 MG tablet Take 1 tablet (4 mg total) by mouth every 6 (six) hours. Patient not taking: Reported on 02/15/2021 07/01/20   Horton, Alvin Critchley, DO    Family History No family history on file.  Social History Social History   Tobacco Use   Smoking status: Never   Smokeless tobacco: Never  Vaping Use   Vaping Use: Every day  Substance Use Topics   Alcohol use: Not Currently   Drug use: Not Currently     Allergies   Lactose intolerance (gi)   Review of Systems Review of Systems  Musculoskeletal:  Positive for arthralgias, gait problem and joint swelling.  Skin:  Negative for color change and wound.  Neurological:  Negative for weakness and numbness.  Physical Exam Triage Vital Signs ED Triage Vitals  Enc Vitals Group     BP      Pulse      Resp      Temp      Temp src      SpO2      Weight      Height      Head Circumference      Peak Flow      Pain Score      Pain Loc      Pain Edu?      Excl. in [place]?    No data found.  Updated Vital Signs BP 127/70 (BP Location: Left Arm)   Pulse 81   Temp 98.4 F (36.9 C) (Oral)   Resp 16   Ht 5\' 5"  (1.651 m)   Wt 128 lb 1.4 oz (58.1 kg)   SpO2 99%   BMI 21.31 kg/m      Physical Exam Vitals and nursing note reviewed.  Constitutional:      General: She is not in acute distress.    Appearance: Normal appearance. She is not ill-appearing or toxic-appearing.  HENT:     Head: Normocephalic and atraumatic.  Eyes:     General: No scleral icterus.       Right eye: No discharge.        Left eye: No discharge.     Conjunctiva/sclera: Conjunctivae normal.  Cardiovascular:      Rate and Rhythm: Normal rate and regular rhythm.     Pulses: Normal pulses.  Pulmonary:     Effort: Pulmonary effort is normal. No respiratory distress.  Musculoskeletal:     Cervical back: Neck supple.     Left knee: Swelling (mild medial knee) present. Decreased range of motion (decreased flexion beyond 90 degrees). Tenderness present over the medial joint line. Normal pulse.  Skin:    General: Skin is dry.  Neurological:     General: No focal deficit present.     Mental Status: She is alert. Mental status is at baseline.     Motor: No weakness.     Gait: Gait abnormal.  Psychiatric:        Mood and Affect: Mood normal.        Behavior: Behavior normal.        Thought Content: Thought content normal.      UC Treatments / Results  Labs (all labs ordered are listed, but only abnormal results are displayed) Labs Reviewed - No data to display  EKG   Radiology DG Knee Complete 4 Views Left  Result Date: 06/06/2022 CLINICAL DATA:  Pain after jumping off something, medial knee pain EXAM: LEFT KNEE - COMPLETE 4+ VIEW COMPARISON:  None Available. FINDINGS: No evidence of fracture, dislocation, or joint effusion. No evidence of arthropathy or other focal bone abnormality. Soft tissue swelling about the medial aspect of the knee joint. IMPRESSION: 1. No acute fracture or dislocation of the left knee. 2. Soft tissue swelling about the medial aspect of the knee joint. If there is clinical concern for ligamentous injury, further evaluation with MR examination is suggested. Electronically Signed   By: Keane Police D.O.   On: 06/06/2022 15:24    Procedures Procedures (including critical care time)  Medications Ordered in UC Medications - No data to display  Initial Impression / Assessment and Plan / UC Course  I have reviewed the triage vital signs and the nursing notes.  Pertinent labs & imaging results  that were available during my care of the patient were reviewed by me and  considered in my medical decision making (see chart for details).   27 year old female presents for left medial knee pain after jumping from several feet onto the ground.  Reports that she believes her knee dislocated and then popped back into place.  This occurred today.  Reports difficulty bearing direct weight on the affected extremity.  No weakness.  X-ray performed today shows soft tissue swelling about the medial aspect of the knee joint.  No bony abnormality.  Radiologist suggested potential MRI with concern for ligamentous injury.  Discussed results with patient.  Given her mechanism of injury and the fact that she believes she dislocated her knee, I have concerns about a potential ligamentous tear.  Her range of motion is also a bit limited with flexion.  Patient given a knee brace and reviewed RICE guidelines and advised ibuprofen and Tylenol for pain relief.  Placed referral to orthopedics for patient for further evaluation.   Final Clinical Impressions(s) / UC Diagnoses   Final diagnoses:  Pain and swelling of left knee  Injury of left knee, initial encounter     Discharge Instructions      SPRAIN: Stressed avoiding painful activities . Reviewed RICE guidelines. Use medications as directed, including NSAIDs. If no NSAIDs have been prescribed for you today, you may take Aleve or Motrin over the counter. May use Tylenol in between doses of NSAIDs.  If no improvement in the next 1-2 weeks, f/u with PCP or return to our office for reexamination, and please feel free to call or return at any time for any questions or concerns you may have and we will be happy to help you!      You have a condition requiring you to follow up with Orthopedics so please call one of the following office for appointment:   [facility] [address] [place], [place] [address] Phone: [phone]  [facility] [address], [place], [place] [address] Phone: [phone]      ED Prescriptions    None    PDMP not reviewed this encounter.   Danton Clap, PA-C 06/06/22 [phone]

## 2022-06-07 ENCOUNTER — Ambulatory Visit (INDEPENDENT_AMBULATORY_CARE_PROVIDER_SITE_OTHER): Payer: BC Managed Care – PPO | Admitting: Orthopedic Surgery

## 2022-06-07 DIAGNOSIS — M23007 Cystic meniscus, unspecified meniscus, left knee: Secondary | ICD-10-CM

## 2022-06-07 DIAGNOSIS — M25362 Other instability, left knee: Secondary | ICD-10-CM

## 2022-06-10 ENCOUNTER — Encounter: Payer: Self-pay | Admitting: Orthopedic Surgery

## 2022-06-10 NOTE — Progress Notes (Signed)
Office Visit Note   Patient: Janet Chen           Date of Birth: Aug 31, 1995           MRN: [id number] Visit Date: 06/07/2022 Requested by: Danton Clap, PA-C [address] [place],  [place] [address] PCP: Gerald Leitz  Subjective: Chief Complaint  Patient presents with   Left Knee - Pain    HPI: Janet Chen is a 27 y.o. female who presents to the office reporting left knee pain.  She describes pain after jumping off a truck on 06/06/2022.  Landed on the ground and felt her patella dislocate.  She works in Building surveyor.  She has been ambulating full weightbearing with a patellar brace.  Knee is feeling better today but she is tired of the instability.  She was seen last year around this time for the same injury.  MRI scan from that time does demonstrate a parameniscal cyst around the anterior horn the lateral meniscus along with evidence of lateral patellar dislocation and edema throughout the medial patellofemoral ligament.  She was a Therapist, sports.  She does report having a little bit of bruising around the left knee tibial tubercle region.  No personal or family history of DVT or pulmonary embolism..                ROS: All systems reviewed are negative as they relate to the chief complaint within the history of present illness.  Patient denies fevers or chills.  Assessment & Plan: Visit Diagnoses: No diagnosis found.  Plan: Impression is left knee recurrent patellar dislocation.  Tibial tubercle trochlear groove distance not significantly increased.  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  She does have some pain around the anterior lateral joint line.  Plan at this time after long discussion with Kole is for left knee MPFL reconstruction along with arthroscopy and cyst decompression.  The risk and benefits are discussed with the patient appointment limited to infection nerve  vessel damage recurrent instability as well as knee stiffness.  Patient understands risk and benefits.  Anticipate weightbearing in a knee immobilizer for the first 3 weeks and then range of motion thereafter.  Plan to use allograft as well.  All questions answered.  Follow-Up Instructions: No follow-ups on file.   Orders:  No orders of the defined types were placed in this encounter.  No orders of the defined types were placed in this encounter.     Procedures: No procedures performed   Clinical Data: No additional findings.  Objective: Vital Signs: There were no vitals taken for this visit.  Physical Exam:  Constitutional: Patient appears well-developed HEENT:  Head: Normocephalic Eyes:EOM are normal Neck: Normal range of motion Cardiovascular: Normal rate Pulmonary/chest: Effort normal Neurologic: Patient is alert Skin: Skin is warm Psychiatric: Patient has normal mood and affect  Ortho Exam: Ortho exam demonstrates increased lateral patella mobility on the left with apprehension.  Trace effusion is present.  Collateral and cruciate ligaments are stable.  Range of motion is full.  She does have some anterior lateral joint line tenderness along with resolving bruising around the anterior medial joint line from the impact.  She has very soft endpoint to lateral translation on the left compared to better endpoint on the right.  No definite patellofemoral crepitus noted.  Specialty Comments:  No specialty comments available.  Imaging: No results found.   [facility]  History: There are no problems to display for this patient.  Past Medical History:  Diagnosis Date   Ectopic pregnancy     No family history on file.  Past Surgical History:  Procedure Laterality Date   Fallopian Tube Removal     Social History   Occupational History   Not on file  Tobacco Use   Smoking status: Never   Smokeless tobacco: Never  Vaping Use   Vaping Use: Every day  Substance and Sexual  Activity   Alcohol use: Not Currently   Drug use: Not Currently   Sexual activity: Not on file

## 2022-06-13 ENCOUNTER — Ambulatory Visit: Payer: BC Managed Care – PPO | Admitting: Orthopedic Surgery

## 2022-06-19 ENCOUNTER — Telehealth: Payer: Self-pay | Admitting: Orthopedic Surgery

## 2022-06-19 NOTE — Telephone Encounter (Signed)
Please advise 

## 2022-06-19 NOTE — Telephone Encounter (Signed)
-  Patient is scheduled for left knee arthroscopy, cyst decompression debridement, MPFL reconstruction with hamstring allograft on 07-03-22 at  [facility] with Dr. Marlou Sa.  She has questions regarding the recovery period. She needs give her employer the amount of time she will be out.   Please call [phone].

## 2022-06-20 NOTE — Telephone Encounter (Signed)
I called and she is going to be out of work probably about 3 weeks.

## 2022-06-20 NOTE — Telephone Encounter (Signed)
noted 

## 2022-06-28 ENCOUNTER — Encounter (HOSPITAL_COMMUNITY): Payer: Self-pay

## 2022-06-28 NOTE — Progress Notes (Signed)
Surgical Instructions    Your procedure is scheduled on Tuesday, July 03, 2022.  Report to [facility] Entrance "A" at 9:30 A.M., then check in with the Admitting office.  Call this number if you have problems the morning of surgery:  [phone]   If you have any questions prior to your surgery date call [phone]: Open Monday-Friday 8am-4pm If you experience any cold or flu symptoms such as cough, fever, chills, shortness of breath, etc. between now and your scheduled surgery, please notify us at the above number     Remember:  Do not eat after midnight the night before your surgery  You may drink clear liquids until 8:30 the morning of your surgery.   Clear liquids allowed are: Water, Non-Citrus Juices (without pulp), Carbonated Beverages, Clear Tea, Black Coffee ONLY (NO MILK, CREAM OR POWDERED CREAMER of any kind), and Gatorade  Patient Instructions  The night before surgery:  No food after midnight. ONLY clear liquids after midnight  The day of surgery (if you do NOT have diabetes):  Drink ONE (1) Pre-Surgery Clear Ensure by 8:30 the morning of surgery. Drink in one sitting. Do not sip.  This drink was given to you during your hospital  pre-op appointment visit.  Nothing else to drink after completing the  Pre-Surgery Clear Ensure.         If you have questions, please contact your surgeon's office.    Take these medicines the morning of surgery with A SIP OF WATER:  busPIRone (BUSPAR)  FLUoxetine (PROZAC)  QUEtiapine (SEROQUEL)   As needed: albuterol (VENTOLIN HFA) Please bring with you to the hospital   As of today, STOP taking any Aspirin (unless otherwise instructed by your surgeon) Aleve, Naproxen, Ibuprofen, Motrin, Advil, Goody's, BC's, all herbal medications, fish oil, and all vitamins.           Do not wear jewelry or makeup. Do not wear lotions, powders, perfumes or deodorant. Do not shave 48 hours prior to surgery.   Do not bring valuables to  the hospital. Do not wear nail polish, gel polish, artificial nails, or any other type of covering on natural nails (fingers and toes) If you have artificial nails or gel coating that need to be removed by a nail salon, please have this removed prior to surgery. Artificial nails or gel coating may interfere with anesthesia's ability to adequately monitor your vital signs.  [facility] is not responsible for any belongings or valuables.    Do NOT Smoke (Tobacco/Vaping)  24 hours prior to your procedure  If you use a CPAP at night, you may bring your mask for your overnight stay.   Contacts, glasses, hearing aids, dentures or partials may not be worn into surgery, please bring cases for these belongings   For patients admitted to the hospital, discharge time will be determined by your treatment team.   Patients discharged the day of surgery will not be allowed to drive home, and someone needs to stay with them for 24 hours.   SURGICAL WAITING ROOM VISITATION Patients having surgery or a procedure may have no more than 2 support people in the waiting area - these visitors may rotate.   Children under the age of 40 must have an adult with them who is not the patient. If the patient needs to stay at the hospital during part of their recovery, the visitor guidelines for inpatient rooms apply. Pre-op nurse will coordinate an appropriate time for 1 support person to  accompany patient in pre-op.  This support person may not rotate.   Please refer to [email or web address] for the visitor guidelines for Inpatients (after your surgery is over and you are in a regular room).    Special instructions:    Oral Hygiene is also important to reduce your risk of infection.  Remember - BRUSH YOUR TEETH THE MORNING OF SURGERY WITH YOUR REGULAR TOOTHPASTE   [facility]- Preparing For Surgery  Before surgery, you can play an important role. Because skin is not  sterile, your skin needs to be as free of germs as possible. You can reduce the number of germs on your skin by washing with CHG (chlorahexidine gluconate) Soap before surgery.  CHG is an antiseptic cleaner which kills germs and bonds with the skin to continue killing germs even after washing.     Please do not use if you have an allergy to CHG or antibacterial soaps. If your skin becomes reddened/irritated stop using the CHG.  Do not shave (including legs and underarms) for at least 48 hours prior to first CHG shower. It is OK to shave your face.  Please follow these instructions carefully.     Shower the NIGHT BEFORE SURGERY and the MORNING OF SURGERY with CHG Soap.   If you chose to wash your hair, wash your hair first as usual with your normal shampoo. After you shampoo, rinse your hair and body thoroughly to remove the shampoo.  Then [employer] and genitals (private parts) with your normal soap and rinse thoroughly to remove soap.  After that Use CHG Soap as you would any other liquid soap. You can apply CHG directly to the skin and wash gently with a scrungie or a clean washcloth.   Apply the CHG Soap to your body ONLY FROM THE NECK DOWN.  Do not use on open wounds or open sores. Avoid contact with your eyes, ears, mouth and genitals (private parts). Wash Face and genitals (private parts)  with your normal soap.   Wash thoroughly, paying special attention to the area where your surgery will be performed.  Thoroughly rinse your body with warm water from the neck down.  DO NOT shower/wash with your normal soap after using and rinsing off the CHG Soap.  Pat yourself dry with a CLEAN TOWEL.  Wear CLEAN PAJAMAS to bed the night before surgery  Place CLEAN SHEETS on your bed the night before your surgery  DO NOT SLEEP WITH PETS.   Day of Surgery:  Take a shower with CHG soap. Wear Clean/Comfortable clothing the morning of surgery Do not apply any deodorants/lotions.   Remember to  brush your teeth WITH YOUR REGULAR TOOTHPASTE.    If you received a COVID test during your pre-op visit, it is requested that you wear a mask when out in public, stay away from anyone that may not be feeling well, and notify your surgeon if you develop symptoms. If you have been in contact with anyone that has tested positive in the last 10 days, please notify your surgeon.    Please read over the following fact sheets that you were given.

## 2022-06-29 ENCOUNTER — Other Ambulatory Visit: Payer: Self-pay

## 2022-06-29 ENCOUNTER — Encounter (HOSPITAL_COMMUNITY)
Admission: RE | Admit: 2022-06-29 | Discharge: 2022-06-29 | Disposition: A | Discharge status: Home / Self Care | Payer: Medicaid Other | Source: Ambulatory Visit | Attending: Orthopedic Surgery | Admitting: Orthopedic Surgery

## 2022-06-29 ENCOUNTER — Encounter (HOSPITAL_COMMUNITY): Payer: Self-pay

## 2022-06-29 ENCOUNTER — Encounter: Payer: Self-pay | Admitting: Orthopedic Surgery

## 2022-06-29 DIAGNOSIS — Z01818 Encounter for other preprocedural examination: Secondary | ICD-10-CM | POA: Diagnosis present

## 2022-06-29 HISTORY — DX: Unspecified asthma, uncomplicated: J45.909

## 2022-06-29 HISTORY — DX: Anxiety disorder, unspecified: F41.9

## 2022-06-29 HISTORY — DX: Depression, unspecified: F32.A

## 2022-06-29 HISTORY — DX: Bipolar disorder, unspecified: F31.9

## 2022-06-29 HISTORY — DX: Insomnia, unspecified: G47.00

## 2022-06-29 LAB — CBC
HCT: 40.5 % (ref 36.0–46.0)
Hemoglobin: 13.5 g/dL (ref 12.0–15.0)
MCH: 34.1 pg — ABNORMAL HIGH (ref 26.0–34.0)
MCHC: 33.3 g/dL (ref 30.0–36.0)
MCV: 102.3 fL — ABNORMAL HIGH (ref 80.0–100.0)
Platelets: 255 10*3/uL (ref 150–400)
RBC: 3.96 MIL/uL (ref 3.87–5.11)
RDW: 11.9 % (ref 11.5–15.5)
WBC: 6.2 10*3/uL (ref 4.0–10.5)
nRBC: 0 % (ref 0.0–0.2)

## 2022-06-29 LAB — BASIC METABOLIC PANEL
Anion gap: 8 (ref 5–15)
BUN: 12 mg/dL (ref 6–20)
CO2: 27 mmol/L (ref 22–32)
Calcium: 9.2 mg/dL (ref 8.9–10.3)
Chloride: 103 mmol/L (ref 98–111)
Creatinine, Ser: 0.89 mg/dL (ref 0.44–1.00)
GFR, Estimated: >60 mL/min (ref >60)
Glucose, Bld: 88 mg/dL (ref 70–99)
Potassium: 4.1 mmol/L (ref 3.5–5.1)
Sodium: 138 mmol/L (ref 135–145)

## 2022-06-29 NOTE — Progress Notes (Signed)
PCP - Park Meo PA Cardiologist - Denies  PPM/ICD - Denies  Chest x-ray - NI EKG -NI  Stress Test - Denies ECHO - Denies Cardiac Cath - Denies  Sleep Study - Denies  DM - Denies  ERAS Protcol -Yes PRE-SURGERY Ensure given   COVID TEST- NI  Patient denies any respiratory infection, illness, PNA, cold or Covid in the last two months.   Anesthesia review: No  Patient denies shortness of breath, fever, cough and chest pain at PAT appointment   All instructions explained to the patient, with a verbal understanding of the material. Patient agrees to go over the instructions while at home for a better understanding.  The opportunity to ask questions was provided.

## 2022-07-03 ENCOUNTER — Other Ambulatory Visit: Payer: Self-pay

## 2022-07-03 ENCOUNTER — Ambulatory Visit (HOSPITAL_COMMUNITY): Payer: Medicaid Other | Admitting: Certified Registered"

## 2022-07-03 ENCOUNTER — Encounter (HOSPITAL_COMMUNITY)
Admission: RE | Disposition: A | Discharge status: Home / Self Care | Payer: Self-pay | Source: Home / Self Care | Attending: Orthopedic Surgery

## 2022-07-03 ENCOUNTER — Ambulatory Visit (HOSPITAL_COMMUNITY)
Admission: RE | Admit: 2022-07-03 | Discharge: 2022-07-03 | Disposition: A | Discharge status: Home / Self Care | Payer: Medicaid Other | Attending: Orthopedic Surgery | Admitting: Orthopedic Surgery

## 2022-07-03 ENCOUNTER — Other Ambulatory Visit: Payer: Self-pay | Admitting: Surgical

## 2022-07-03 ENCOUNTER — Encounter (HOSPITAL_COMMUNITY): Payer: Self-pay | Admitting: Orthopedic Surgery

## 2022-07-03 ENCOUNTER — Ambulatory Visit (HOSPITAL_COMMUNITY): Payer: Medicaid Other

## 2022-07-03 DIAGNOSIS — M2352 Chronic instability of knee, left knee: Secondary | ICD-10-CM | POA: Diagnosis present

## 2022-07-03 DIAGNOSIS — M25362 Other instability, left knee: Secondary | ICD-10-CM

## 2022-07-03 DIAGNOSIS — M23007 Cystic meniscus, unspecified meniscus, left knee: Secondary | ICD-10-CM | POA: Diagnosis not present

## 2022-07-03 DIAGNOSIS — M23042 Cystic meniscus, anterior horn of lateral meniscus, left knee: Secondary | ICD-10-CM | POA: Diagnosis not present

## 2022-07-03 DIAGNOSIS — F419 Anxiety disorder, unspecified: Secondary | ICD-10-CM | POA: Insufficient documentation

## 2022-07-03 DIAGNOSIS — F319 Bipolar disorder, unspecified: Secondary | ICD-10-CM | POA: Diagnosis not present

## 2022-07-03 DIAGNOSIS — Z01818 Encounter for other preprocedural examination: Secondary | ICD-10-CM

## 2022-07-03 HISTORY — PX: MEDIAL PATELLOFEMORAL LIGAMENT REPAIR: SHX2020

## 2022-07-03 LAB — POCT PREGNANCY, URINE: Preg Test, Ur: NEGATIVE

## 2022-07-03 SURGERY — RECONSTRUCTION, LIGAMENT, MEDIAL PATELLOFEMORAL
Anesthesia: General | Site: Knee | Laterality: Left

## 2022-07-03 MED ORDER — ORAL CARE MOUTH RINSE: 15.0000 mL | Freq: Once | OROMUCOSAL | Status: AC

## 2022-07-03 MED ORDER — ONDANSETRON HCL 4 MG/2ML IJ SOLN: 4.0000 mg | Freq: Once | INTRAMUSCULAR | Status: DC | PRN

## 2022-07-03 MED ORDER — SODIUM CHLORIDE 0.9 % IR SOLN
Status: DC | PRN
  Administered 2022-07-03: 1000 mL

## 2022-07-03 MED ORDER — DEXMEDETOMIDINE HCL IN NACL 80 MCG/20ML IV SOLN
INTRAVENOUS | Status: DC | PRN
  Administered 2022-07-03: 8 ug via BUCCAL
  Administered 2022-07-03: 4 ug via BUCCAL

## 2022-07-03 MED ORDER — DEXAMETHASONE SODIUM PHOSPHATE 10 MG/ML IJ SOLN
INTRAMUSCULAR | Status: AC
  Filled 2022-07-03: qty 1

## 2022-07-03 MED ORDER — METHOCARBAMOL 500 MG PO TABS: 500.0000 mg | ORAL_TABLET | Freq: Three times a day (TID) | ORAL | 1 refills | Status: DC | PRN

## 2022-07-03 MED ORDER — OXYCODONE HCL 5 MG/5ML PO SOLN: 5.0000 mg | Freq: Once | ORAL | Status: DC | PRN

## 2022-07-03 MED ORDER — EPINEPHRINE PF 1 MG/ML IJ SOLN
INTRAMUSCULAR | Status: AC
  Filled 2022-07-03: qty 1

## 2022-07-03 MED ORDER — ACETAMINOPHEN 325 MG PO TABS: 325.0000 mg | ORAL_TABLET | ORAL | Status: DC | PRN

## 2022-07-03 MED ORDER — DEXAMETHASONE SODIUM PHOSPHATE 10 MG/ML IJ SOLN
INTRAMUSCULAR | Status: DC | PRN
  Administered 2022-07-03: 10 mg

## 2022-07-03 MED ORDER — ROCURONIUM BROMIDE 10 MG/ML (PF) SYRINGE
PREFILLED_SYRINGE | INTRAVENOUS | Status: AC
  Filled 2022-07-03: qty 10

## 2022-07-03 MED ORDER — MIDAZOLAM HCL 2 MG/2ML IJ SOLN
INTRAMUSCULAR | Status: DC | PRN
  Administered 2022-07-03 (×2): 1 mg via INTRAVENOUS

## 2022-07-03 MED ORDER — LIDOCAINE 2% (20 MG/ML) 5 ML SYRINGE
INTRAMUSCULAR | Status: AC
  Filled 2022-07-03: qty 5

## 2022-07-03 MED ORDER — LACTATED RINGERS IV SOLN: INTRAVENOUS | Status: DC

## 2022-07-03 MED ORDER — POVIDONE-IODINE 7.5 % EX SOLN: Freq: Once | CUTANEOUS | Status: DC

## 2022-07-03 MED ORDER — MORPHINE SULFATE (PF) 4 MG/ML IV SOLN
INTRAVENOUS | Status: DC | PRN
  Administered 2022-07-03: 8 mg

## 2022-07-03 MED ORDER — ACETAMINOPHEN 160 MG/5ML PO SOLN: 325.0000 mg | ORAL | Status: DC | PRN

## 2022-07-03 MED ORDER — LIDOCAINE 2% (20 MG/ML) 5 ML SYRINGE
INTRAMUSCULAR | Status: DC | PRN
  Administered 2022-07-03: 100 mg via INTRAVENOUS

## 2022-07-03 MED ORDER — CLONIDINE HCL (ANALGESIA) 100 MCG/ML EP SOLN
EPIDURAL | Status: AC
  Filled 2022-07-03: qty 10

## 2022-07-03 MED ORDER — KETOROLAC TROMETHAMINE 30 MG/ML IJ SOLN
30.0000 mg | Freq: Once | INTRAMUSCULAR | Status: AC | PRN
  Administered 2022-07-03: 30 mg via INTRAVENOUS

## 2022-07-03 MED ORDER — CLONIDINE HCL (ANALGESIA) 100 MCG/ML EP SOLN
EPIDURAL | Status: DC | PRN
  Administered 2022-07-03: 1 mL

## 2022-07-03 MED ORDER — ONDANSETRON HCL 4 MG/2ML IJ SOLN
INTRAMUSCULAR | Status: AC
  Filled 2022-07-03: qty 2

## 2022-07-03 MED ORDER — POVIDONE-IODINE 10 % EX SWAB
2.0000 | Freq: Once | CUTANEOUS | Status: AC
  Administered 2022-07-03: 2 via TOPICAL

## 2022-07-03 MED ORDER — HYDROMORPHONE HCL 1 MG/ML IJ SOLN: 0.2500 mg | INTRAMUSCULAR | Status: DC | PRN

## 2022-07-03 MED ORDER — ONDANSETRON HCL 4 MG/2ML IJ SOLN
INTRAMUSCULAR | Status: DC | PRN
  Administered 2022-07-03: 4 mg via INTRAVENOUS

## 2022-07-03 MED ORDER — FENTANYL CITRATE (PF) 250 MCG/5ML IJ SOLN
INTRAMUSCULAR | Status: AC
  Filled 2022-07-03: qty 5

## 2022-07-03 MED ORDER — DEXMEDETOMIDINE HCL IN NACL 80 MCG/20ML IV SOLN
INTRAVENOUS | Status: AC
  Filled 2022-07-03: qty 20

## 2022-07-03 MED ORDER — OXYCODONE HCL 5 MG PO TABS: 5.0000 mg | ORAL_TABLET | ORAL | 0 refills | Status: DC | PRN

## 2022-07-03 MED ORDER — POVIDONE-IODINE 10 % EX SWAB: 2.0000 | Freq: Once | CUTANEOUS | Status: DC

## 2022-07-03 MED ORDER — MEPERIDINE HCL 25 MG/ML IJ SOLN: 6.2500 mg | INTRAMUSCULAR | Status: DC | PRN

## 2022-07-03 MED ORDER — SODIUM CHLORIDE 0.9 % IR SOLN
Status: DC | PRN
  Administered 2022-07-03: 3000 mL

## 2022-07-03 MED ORDER — ASPIRIN 81 MG PO CHEW: 81.0000 mg | CHEWABLE_TABLET | Freq: Two times a day (BID) | ORAL | 0 refills | Status: AC

## 2022-07-03 MED ORDER — PROPOFOL 10 MG/ML IV BOLUS
INTRAVENOUS | Status: AC
  Filled 2022-07-03: qty 20

## 2022-07-03 MED ORDER — MORPHINE SULFATE (PF) 4 MG/ML IV SOLN
INTRAVENOUS | Status: AC
  Filled 2022-07-03: qty 2

## 2022-07-03 MED ORDER — BUPIVACAINE HCL (PF) 0.25 % IJ SOLN
INTRAMUSCULAR | Status: DC | PRN
  Administered 2022-07-03: 30 mL

## 2022-07-03 MED ORDER — OXYCODONE HCL 5 MG PO TABS: 5.0000 mg | ORAL_TABLET | Freq: Once | ORAL | Status: DC | PRN

## 2022-07-03 MED ORDER — TRANEXAMIC ACID-NACL 1000-0.7 MG/100ML-% IV SOLN
INTRAVENOUS | Status: AC
  Filled 2022-07-03: qty 100

## 2022-07-03 MED ORDER — FENTANYL CITRATE (PF) 250 MCG/5ML IJ SOLN
INTRAMUSCULAR | Status: DC | PRN
  Administered 2022-07-03 (×3): 50 ug via INTRAVENOUS

## 2022-07-03 MED ORDER — MIDAZOLAM HCL 2 MG/2ML IJ SOLN
INTRAMUSCULAR | Status: AC
  Filled 2022-07-03: qty 2

## 2022-07-03 MED ORDER — CLONIDINE HCL (ANALGESIA) 100 MCG/ML EP SOLN
EPIDURAL | Status: DC | PRN
  Administered 2022-07-03: 100 ug

## 2022-07-03 MED ORDER — CHLORHEXIDINE GLUCONATE 0.12 % MT SOLN
15.0000 mL | Freq: Once | OROMUCOSAL | Status: AC
  Administered 2022-07-03: 15 mL via OROMUCOSAL

## 2022-07-03 MED ORDER — KETOROLAC TROMETHAMINE 30 MG/ML IJ SOLN
INTRAMUSCULAR | Status: AC
  Filled 2022-07-03: qty 1

## 2022-07-03 MED ORDER — ROPIVACAINE HCL 5 MG/ML IJ SOLN
INTRAMUSCULAR | Status: DC | PRN
  Administered 2022-07-03 (×4): 5 mL via PERINEURAL

## 2022-07-03 MED ORDER — BUPIVACAINE-EPINEPHRINE (PF) 0.25% -1:200000 IJ SOLN
INTRAMUSCULAR | Status: AC
  Filled 2022-07-03: qty 30

## 2022-07-03 MED ORDER — DEXAMETHASONE SODIUM PHOSPHATE 10 MG/ML IJ SOLN
INTRAMUSCULAR | Status: DC | PRN
  Administered 2022-07-03: 8 mg via INTRAVENOUS

## 2022-07-03 MED ORDER — CEFAZOLIN SODIUM-DEXTROSE 2-4 GM/100ML-% IV SOLN
2.0000 g | INTRAVENOUS | Status: AC
  Administered 2022-07-03: 2 g via INTRAVENOUS

## 2022-07-03 MED ORDER — TRANEXAMIC ACID-NACL 1000-0.7 MG/100ML-% IV SOLN
1000.0000 mg | INTRAVENOUS | Status: AC
  Administered 2022-07-03: 1000 mg via INTRAVENOUS

## 2022-07-03 MED ORDER — FENTANYL CITRATE (PF) 100 MCG/2ML IJ SOLN
INTRAMUSCULAR | Status: AC
  Filled 2022-07-03: qty 2

## 2022-07-03 MED ORDER — CELECOXIB 100 MG PO CAPS: 100.0000 mg | ORAL_CAPSULE | Freq: Two times a day (BID) | ORAL | 0 refills | Status: DC

## 2022-07-03 MED ORDER — PROPOFOL 10 MG/ML IV BOLUS
INTRAVENOUS | Status: DC | PRN
  Administered 2022-07-03: 200 mg via INTRAVENOUS

## 2022-07-03 MED ORDER — VANCOMYCIN HCL 1000 MG IV SOLR
INTRAVENOUS | Status: AC
  Filled 2022-07-03: qty 20

## 2022-07-03 MED ORDER — CEFAZOLIN SODIUM-DEXTROSE 2-4 GM/100ML-% IV SOLN
INTRAVENOUS | Status: AC
  Filled 2022-07-03: qty 100

## 2022-07-03 SURGICAL SUPPLY — 95 items
ANCH DBL 2.6 SLF-PNCH FIBERTAK (Anchor) ×2 IMPLANT
ANCH SUT FBRTK 2 LD KNTLS (Anchor) ×2 IMPLANT
ANCHOR DBL 2.6 SLF-PNCH FIBRTK (Anchor) IMPLANT
BAG COUNTER SPONGE SURGICOUNT (BAG) ×1 IMPLANT
BAG SPNG CNTER NS LX DISP (BAG) ×1
BANDAGE ESMARK 6X9 LF (GAUZE/BANDAGES/DRESSINGS) ×1 IMPLANT
BLADE CUTTER GATOR 3.5 (BLADE) IMPLANT
BLADE EXCALIBUR 4.0X13 (MISCELLANEOUS) IMPLANT
BLADE SURG 10 STRL SS (BLADE) ×1 IMPLANT
BLADE SURG 15 STRL LF DISP TIS (BLADE) ×2 IMPLANT
BLADE SURG 15 STRL SS (BLADE) ×2
BNDG CMPR 9X6 STRL LF SNTH (GAUZE/BANDAGES/DRESSINGS)
BNDG CMPR MED 15X6 ELC VLCR LF (GAUZE/BANDAGES/DRESSINGS)
BNDG ELASTIC 6X15 VLCR STRL LF (GAUZE/BANDAGES/DRESSINGS) ×1 IMPLANT
BNDG ELASTIC 6X5.8 VLCR STR LF (GAUZE/BANDAGES/DRESSINGS) ×1 IMPLANT
BNDG ESMARK 6X9 LF (GAUZE/BANDAGES/DRESSINGS)
CLSR STERI-STRIP ANTIMIC 1/2X4 (GAUZE/BANDAGES/DRESSINGS) IMPLANT
COOLER ICEMAN CLASSIC (MISCELLANEOUS) IMPLANT
COVER MAYO STAND STRL (DRAPES) IMPLANT
COVER SURGICAL LIGHT HANDLE (MISCELLANEOUS) ×1 IMPLANT
CUFF TOURN SGL QUICK 34 (TOURNIQUET CUFF) ×1
CUFF TOURN SGL QUICK 42 (TOURNIQUET CUFF) IMPLANT
CUFF TRNQT CYL 34X4.125X (TOURNIQUET CUFF) ×1 IMPLANT
DISSECTOR  3.8MM X 13CM (MISCELLANEOUS) ×1
DISSECTOR 3.8MM X 13CM (MISCELLANEOUS) IMPLANT
DRAPE ARTHROSCOPY W/POUCH 114 (DRAPES) ×1 IMPLANT
DRAPE C-ARM 42X72 X-RAY (DRAPES) ×1 IMPLANT
DRAPE INCISE IOBAN 66X45 STRL (DRAPES) ×1 IMPLANT
DRAPE ORTHO SPLIT 77X108 STRL (DRAPES) ×1
DRAPE SURG ORHT 6 SPLT 77X108 (DRAPES) ×1 IMPLANT
DRAPE U-SHAPE 47X51 STRL (DRAPES) ×1 IMPLANT
DRSG TEGADERM 4X4.75 (GAUZE/BANDAGES/DRESSINGS) IMPLANT
ELECT REM PT RETURN 9FT ADLT (ELECTROSURGICAL) ×1
ELECTRODE REM PT RTRN 9FT ADLT (ELECTROSURGICAL) ×1 IMPLANT
EVACUATOR 1/8 PVC DRAIN (DRAIN) IMPLANT
FHL IMPLANT SYSTEM 7 (Anchor) ×1 IMPLANT
GAUZE PAD ABD 8X10 STRL (GAUZE/BANDAGES/DRESSINGS) ×3 IMPLANT
GAUZE SPONGE 4X4 12PLY STRL (GAUZE/BANDAGES/DRESSINGS) ×2 IMPLANT
GAUZE XEROFORM 1X8 LF (GAUZE/BANDAGES/DRESSINGS) ×1 IMPLANT
GLOVE BIOGEL PI IND STRL 8 (GLOVE) ×1 IMPLANT
GLOVE ECLIPSE 8.0 STRL XLNG CF (GLOVE) ×1 IMPLANT
GOWN STRL REUS W/ TWL LRG LVL3 (GOWN DISPOSABLE) ×3 IMPLANT
GOWN STRL REUS W/ TWL XL LVL3 (GOWN DISPOSABLE) ×1 IMPLANT
GOWN STRL REUS W/TWL LRG LVL3 (GOWN DISPOSABLE) ×3
GOWN STRL REUS W/TWL XL LVL3 (GOWN DISPOSABLE) ×1
GRAFT TISS SEMITEND 4-8 (Bone Implant) IMPLANT
IMMOBILIZER KNEE 22 UNIV (SOFTGOODS) IMPLANT
KIT BASIN OR (CUSTOM PROCEDURE TRAY) ×1 IMPLANT
KIT BIO-TENODESIS 3X8 DISP (MISCELLANEOUS)
KIT INSRT BABSR STRL DISP BTN (MISCELLANEOUS) IMPLANT
KIT KNEE FIBERTAK DISP (KITS) IMPLANT
KIT TRANSTIBIAL (DISPOSABLE) IMPLANT
KIT TURNOVER KIT B (KITS) ×1 IMPLANT
MANIFOLD NEPTUNE II (INSTRUMENTS) ×1 IMPLANT
NDL 18GX1X1/2 (RX/OR ONLY) (NEEDLE) ×1 IMPLANT
NEEDLE 18GX1X1/2 (RX/OR ONLY) (NEEDLE) ×1 IMPLANT
NS IRRIG 1000ML POUR BTL (IV SOLUTION) ×1 IMPLANT
PACK ARTHROSCOPY DSU (CUSTOM PROCEDURE TRAY) ×1 IMPLANT
PAD ARMBOARD 7.5X6 YLW CONV (MISCELLANEOUS) ×2 IMPLANT
PAD CAST 4YDX4 CTTN HI CHSV (CAST SUPPLIES) ×1 IMPLANT
PAD COLD SHLDR WRAP-ON (PAD) IMPLANT
PADDING CAST COTTON 4X4 STRL (CAST SUPPLIES)
PADDING CAST COTTON 6X4 STRL (CAST SUPPLIES) ×3 IMPLANT
PASSER SUT SWANSON 36MM LOOP (INSTRUMENTS) ×1 IMPLANT
PENCIL BUTTON HOLSTER BLD 10FT (ELECTRODE) ×1 IMPLANT
SPIKE FLUID TRANSFER (MISCELLANEOUS) ×1 IMPLANT
SPONGE T-LAP 4X18 ~~LOC~~+RFID (SPONGE) ×2 IMPLANT
SUCTION FRAZIER HANDLE 10FR (MISCELLANEOUS) ×1
SUCTION TUBE FRAZIER 10FR DISP (MISCELLANEOUS) ×1 IMPLANT
SUT 2 FIBERLOOP 20 STRT BLUE (SUTURE)
SUT ETHILON 3 0 PS 1 (SUTURE) ×1 IMPLANT
SUT FIBERWIRE #2 38 T-5 BLUE (SUTURE)
SUT MNCRL AB 3-0 PS2 27 (SUTURE) IMPLANT
SUT PROLENE 3 0 PS 2 (SUTURE) ×1 IMPLANT
SUT VIC AB 0 CT1 27 (SUTURE) ×1
SUT VIC AB 0 CT1 27XBRD ANBCTR (SUTURE) ×2 IMPLANT
SUT VIC AB 1 CT1 27 (SUTURE) ×2
SUT VIC AB 1 CT1 27XBRD ANTBC (SUTURE) IMPLANT
SUT VIC AB 2-0 CT1 27 (SUTURE) ×2
SUT VIC AB 2-0 CT1 TAPERPNT 27 (SUTURE) ×1 IMPLANT
SUTURE 2 FIBERLOOP 20 STRT BLU (SUTURE) ×1 IMPLANT
SUTURE FIBERWR #2 38 T-5 BLUE (SUTURE) ×2 IMPLANT
SYR 30ML LL (SYRINGE) ×1 IMPLANT
SYR BULB IRRIG 60ML STRL (SYRINGE) ×1 IMPLANT
SYR TB 1ML LUER SLIP (SYRINGE) ×1 IMPLANT
SYSTEM IMPLANT FHL 7 (Anchor) IMPLANT
TENDON SEMI-TENDINOSUS (Bone Implant) ×1 IMPLANT
TOWEL GREEN STERILE (TOWEL DISPOSABLE) ×1 IMPLANT
TOWEL GREEN STERILE FF (TOWEL DISPOSABLE) ×1 IMPLANT
TUBING ARTHROSCOPY IRRIG 16FT (MISCELLANEOUS) ×1 IMPLANT
UNDERPAD 30X36 HEAVY ABSORB (UNDERPADS AND DIAPERS) ×1 IMPLANT
WAND ABLATOR APOLLO I90 (BUR) IMPLANT
WATER STERILE IRR 1000ML POUR (IV SOLUTION) ×1 IMPLANT
WRAP KNEE MAXI GEL POST OP (GAUZE/BANDAGES/DRESSINGS) IMPLANT
YANKAUER SUCT BULB TIP NO VENT (SUCTIONS) ×1 IMPLANT

## 2022-07-03 NOTE — Anesthesia Postprocedure Evaluation (Signed)
Anesthesia Post Note  Patient: Janet Chen  Procedure(s) Performed: LEFT KNEE ARTHROSCOPY, CYST DECOMPRESSION, DEBRIDEMENT, MEDIAL PATELLOFEMORAL LIGAMENT RECONSTRUCTION WITH HAMSTRING ALLOGRAFT (Left: Knee)     Patient location during evaluation: PACU Anesthesia Type: General Level of consciousness: awake Pain management: pain level controlled Vital Signs Assessment: post-procedure vital signs reviewed and stable Respiratory status: spontaneous breathing Cardiovascular status: stable Postop Assessment: no apparent nausea or vomiting Anesthetic complications: no  No notable events documented.  Last Vitals:  Vitals:   07/03/22 1500 07/03/22 1515  BP: (!) 108/55 117/61  Pulse: 86 80  Resp: 18 13  Temp:    SpO2: 97% 98%    Last Pain:  Vitals:   07/03/22 1515  TempSrc:   PainSc: 0-No pain    LLE Motor Response: Purposeful movement (07/03/22 1515) LLE Sensation: Full sensation (07/03/22 1515)          Huston Foley

## 2022-07-03 NOTE — Progress Notes (Signed)
Orthopedic Tech Progress Note Patient Details:  Siah [place] Leder 27-Sep-1995 [id number]  Ortho Devices Type of Ortho Device: Crutches Ortho Device/Splint Interventions: Ordered, Application, Adjustment   Post Interventions Patient Tolerated: Well Instructions Provided: Adjustment of device, Poper ambulation with device  Miles Borkowski OTR/L  07/03/2022, 4:27 PM

## 2022-07-03 NOTE — H&P (Signed)
Janet Chen is an 27 y.o. female.   Chief Complaint: Left knee pain and instability of the patella HPI:  Janet Chen is a 27 y.o. female who presents with left knee pain.  She describes pain after jumping off a truck on 06/06/2022.  Landed on the ground and felt her patella dislocate.  She works in Building surveyor.  She has been ambulating full weightbearing with a patellar brace.  Knee is feeling better today but she is tired of the instability.  She was seen last year around this time for the same injury.  MRI scan from that time does demonstrate a parameniscal cyst around the anterior horn the lateral meniscus along with evidence of lateral patellar dislocation and edema throughout the medial patellofemoral ligament.  She was a Therapist, sports.  She does report having a little bit of bruising around the left knee tibial tubercle region.  No personal or family history of DVT or pulmonary embolism..     Past Medical History:  Diagnosis Date   Anxiety    Asthma    Bipolar disorder ([place])    Depression    Ectopic pregnancy    Insomnia     Past Surgical History:  Procedure Laterality Date   Fallopian Tube Removal      History reviewed. No pertinent family history. Social History:  reports that she has never smoked. She has never used smokeless tobacco. She reports that she does not currently use alcohol. She reports that she does not currently use drugs.  Allergies:  Allergies  Allergen Reactions   Lactose Intolerance (Gi) Other (See Comments)    Upset stomach    Medications Prior to Admission  Medication Sig Dispense Refill   albuterol (VENTOLIN HFA) 108 (90 Base) MCG/ACT inhaler Inhale 2 puffs into the lungs every 6 (six) hours as needed for wheezing.     busPIRone (BUSPAR) 10 MG tablet Take 10 mg by mouth 2 (two) times daily.     etonogestrel (NEXPLANON) 68 MG IMPL implant 1 each by Subdermal route continuous.     FLUoxetine (PROZAC) 40 MG capsule Take 80 mg by mouth  daily.     QUEtiapine (SEROQUEL) 400 MG tablet Take 400 mg by mouth at bedtime.     QUEtiapine (SEROQUEL) 50 MG tablet Take 50 mg by mouth in the morning.     ondansetron (ZOFRAN) 4 MG tablet Take 1 tablet (4 mg total) by mouth every 6 (six) hours. (Patient not taking: Reported on 02/15/2021) 12 tablet 0    Results for orders placed or performed during the hospital encounter of 07/03/22 (from the past 48 hour(s))  Pregnancy, urine POC     Status: None   Collection Time: 07/03/22  9:39 AM  Result Value Ref Range   Preg Test, Ur NEGATIVE NEGATIVE    Comment:        THE SENSITIVITY OF THIS METHODOLOGY IS >24 mIU/mL    No results found.  Review of Systems  Musculoskeletal:  Positive for arthralgias.  All other systems reviewed and are negative.   Blood pressure 113/73, pulse 75, temperature 98.8 F (37.1 C), temperature source Oral, resp. rate 17, height '5\' 5"'$  (1.651 m), weight 67.1 kg, SpO2 100 %. Physical Exam Vitals reviewed.  HENT:     Head: Normocephalic.     Nose: Nose normal.     Mouth/Throat:     Mouth: Mucous membranes are moist.  Eyes:     Pupils: Pupils are equal, round, and reactive to light.  Cardiovascular:     Rate and Rhythm: Normal rate.     Pulses: Normal pulses.  Pulmonary:     Effort: Pulmonary effort is normal.  Abdominal:     General: Abdomen is flat.  Musculoskeletal:     Cervical back: Normal range of motion.  Skin:    General: Skin is warm.     Capillary Refill: Capillary refill takes less than 2 seconds.  Neurological:     General: No focal deficit present.     Mental Status: She is alert.  Psychiatric:        Mood and Affect: Mood normal.    Ortho exam demonstrates increased lateral patella mobility on the left with apprehension.  Trace effusion is present.  Collateral and cruciate ligaments are stable.  Range of motion is full.  She does have some anterior lateral joint line tenderness along with resolving bruising around the anterior medial  joint line from the impact.  She has very soft endpoint to lateral translation on the left compared to better endpoint on the right.  No definite patellofemoral crepitus noted.    Assessment/Plan  Impression is left knee recurrent patellar dislocation.  Tibial tubercle trochlear groove distance not significantly increased.  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  She does have some pain around the anterior lateral joint line.  Plan at this time after long discussion with Kalisa is for left knee MPFL reconstruction along with arthroscopy and cyst decompression.  The risk and benefits are discussed with the patient appointment limited to infection nerve vessel damage recurrent instability as well as knee stiffness.  Patient understands risk and benefits.  Anticipate weightbearing in a knee immobilizer for the first 3 weeks and then range of motion thereafter.  Plan to use allograft as well.  All questions answered.   Anderson Malta, MD 07/03/2022, 11:29 AM

## 2022-07-03 NOTE — Op Note (Unsigned)
NAME: Janet Chen, TIEU MEDICAL RECORD NO: [id number] ACCOUNT NO: [id number] DATE OF BIRTH: 21-Jul-1995 FACILITY: [facility] LOCATION: [facility] PHYSICIAN: Yetta Barre. 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  PROCEDURE:  Left knee arthroscopy with meniscal cyst decompression and open MPFL reconstruction.  SURGEON:  Yetta Barre. Marlou Sa, MD  ASSISTANT:  Annie Main.  INDICATIONS:  This is a 27 year old patient with patellar instability, who presents for operative management after explanation of risks and benefits.  She also has a meniscal cyst.  DESCRIPTION OF PROCEDURE:  The patient was brought to the operating room where general endotracheal anesthesia was induced.  Preoperative antibiotics administered.  Timeout was called.  Left knee was examined under anesthesia.  Found to have full range  of motion from about 5 degrees of hyperextension full flexion with good stability to varus and valgus stress at 0, 30 and 90 degrees.  Patella mobility was increased to be dislocatable with lateral motion and had about 2.5 quadrants of medial laxity as  well without instability.  The left leg was then pre-scrubbed with alcohol and Betadine, allowed to air dry, prepped with DuraPrep solution and draped in sterile manner.  Timeout was called.  Anterior inferolateral portal was established, anterior  inferomedial portal was established under direct visualization.  Diagnostic arthroscopy was performed.  The patient has never wear on the medial and lateral facet of the patella.  No loose bodies medial or lateral gutter.  The ACL and PCL were intact.   Medial compartment articular cartilage and meniscus intact.  The patient did have old partial thickness chondral wear on that lateral aspect of the lateral femoral condyle consistent with patellar  instability, but this was not a new injury.  It was  sealed over with no loose chondral flaps.  Meniscal cyst was visualized anterior to the anterior horn of the lateral meniscus.  This was debrided using a shaver and Arthrocare wand.  Full decompression was achieved.  A thorough irrigation of the knee  joint was performed and instruments were removed.  Portals were covered with Tegaderm dressing.  Next, the left leg was elevated and exsanguinated with Esmarch wrap.  Tourniquet was inflated.  Incision on the medial aspect of the patella was made.  Skin  and subcutaneous tissue were sharply divided.  Layer 2 and 3 was developed down to MPFL attachment site.  Separate incision was then made over the MPFL and medial epicondyle area.  Skin and subcutaneous tissue were sharply divided.  The medial border of  the patella was exposed.  Two knotless suture anchors were placed and a semi T allograft 230 x 4.5 mm was placed and then sutured down to the prepared side of the patellar tendon.  This gave a very nice footprint of coverage.  These 2 ends were then  taken extraarticular down to the attachment site of the MPFL.  Using a FiberLoop the ends were sutured together.  Then, under fluoroscopic guidance, the isometric attachment site of the MPFL was determined.  A guide pin was then placed in this location  and drilled across the femur.  Next, with the knee in about 20 degrees of flexion the patella was shifted about 2 cm laterally and held in that position while a 7 x 20 interference screw was placed.  The hole was 7 mm.  Very secure fixation was achieved.  The patient was taken through and found to have very nice isometry prior to placement of the screw.  After screw placement the patient had very good endpoint.  After about 2 to 2-1/2 cm of lateral translation both in full extension as well as at 20  degrees of flexion.  Tourniquet was released.  Thorough irrigation was performed of both incisions.  The  retinaculum was then closed over the allograft tendon using #1 Vicryl suture.  The medial incision was closed carefully using 0 Vicryl suture, 2-0 Vicryl suture, and 3-0  Monocryl with Steri-Strips applied.  The upper incision also closed using 2 Vicryl and 3-0 Monocryl with Steri-Strips applied.  A solution of Marcaine, morphine, clonidine injected into the knee for postoperative pain relief.  The patient was then placed  in a knee immobilizer, iceman and Ace wrap with impervious dressings placed.  She tolerated the procedure well without immediate complications.  Luke's assistance was required at all times for retraction, opening, closing, mobilization of tissue.  His  assistance was a medical necessity.   PUS D: 07/03/2022 2:36:33 pm T: 07/03/2022 4:57:00 pm  JOB: [id number]

## 2022-07-03 NOTE — Anesthesia Procedure Notes (Signed)
Procedure Name: LMA Insertion Date/Time: 07/03/2022 12:40 PM  Performed by: Barrington Ellison, CRNAPre-anesthesia Checklist: Patient identified, Emergency Drugs available, Suction available and Patient being monitored Patient Re-evaluated:Patient Re-evaluated prior to induction Oxygen Delivery Method: Circle System Utilized Preoxygenation: Pre-oxygenation with 100% oxygen Induction Type: IV induction Ventilation: Mask ventilation without difficulty LMA: LMA inserted LMA Size: 4.0 Number of attempts: 1 Placement Confirmation: positive ETCO2 Tube secured with: Tape Dental Injury: Teeth and Oropharynx as per pre-operative assessment

## 2022-07-03 NOTE — Anesthesia Preprocedure Evaluation (Signed)
Anesthesia Evaluation  Patient identified by MRN, date of birth, ID band Patient awake    Reviewed: Allergy & Precautions, NPO status , Patient's Chart, lab work & pertinent test results  Airway Mallampati: I       Dental no notable dental hx.    Pulmonary asthma    Pulmonary exam normal        Cardiovascular negative cardio ROS Normal cardiovascular exam     Neuro/Psych  PSYCHIATRIC DISORDERS Anxiety Depression Bipolar Disorder   negative neurological ROS     GI/Hepatic negative GI ROS, Neg liver ROS,,,  Endo/Other  negative endocrine ROS    Renal/GU negative Renal ROS  negative genitourinary   Musculoskeletal negative musculoskeletal ROS (+)    Abdominal Normal abdominal exam  (+)   Peds  Hematology negative hematology ROS (+)   Anesthesia Other Findings   Reproductive/Obstetrics negative OB ROS                              Anesthesia Physical Anesthesia Plan  ASA: 2  Anesthesia Plan: General   Post-op Pain Management: Regional block*   Induction: Intravenous  PONV Risk Score and Plan: 3 and Ondansetron, Dexamethasone and Midazolam  Airway Management Planned: LMA  Additional Equipment: None  Intra-op Plan:   Post-operative Plan: Extubation in OR  Informed Consent: I have reviewed the patients History and Physical, chart, labs and discussed the procedure including the risks, benefits and alternatives for the proposed anesthesia with the patient or authorized representative who has indicated his/her understanding and acceptance.     Dental advisory given  Plan Discussed with: CRNA  Anesthesia Plan Comments:          Anesthesia Quick Evaluation

## 2022-07-03 NOTE — Anesthesia Procedure Notes (Addendum)
Anesthesia Regional Block: Adductor canal block   Pre-Anesthetic Checklist: , timeout performed,  Correct Patient, Correct Site, Correct Laterality,  Correct Procedure, Correct Position, site marked,  Risks and benefits discussed,  Surgical consent,  Pre-op evaluation,  At surgeon's request and post-op pain management  Laterality: Lower and Left  Prep: chloraprep       Needles:  Injection technique: Single-shot  Needle Type: Echogenic Stimulator Needle     Needle Length: 9cm  Needle Gauge: 20   Needle insertion depth: 2.5 cm   Additional Needles:   Procedures:,,,, ultrasound used (permanent image in chart),,    Narrative:  Start time: 07/03/2022 12:20 PM End time: 07/03/2022 12:26 PM Injection made incrementally with aspirations every 5 mL.  Performed by: Personally  Anesthesiologist: Lyn Hollingshead, MD

## 2022-07-03 NOTE — Transfer of Care (Signed)
Immediate Anesthesia Transfer of Care Note  Patient: Janet Chen  Procedure(s) Performed: LEFT KNEE ARTHROSCOPY, CYST DECOMPRESSION, DEBRIDEMENT, MEDIAL PATELLOFEMORAL LIGAMENT RECONSTRUCTION WITH HAMSTRING ALLOGRAFT (Left: Knee)  Patient Location: PACU  Anesthesia Type:General and Regional  Level of Consciousness: awake, alert , and oriented  Airway & Oxygen Therapy: Patient Spontanous Breathing  Post-op Assessment: Report given to RN  Post vital signs: Reviewed and stable  Last Vitals:  Vitals Value Taken Time  BP 120/68   Temp    Pulse 98 07/03/22 1454  Resp 15 07/03/22 1454  SpO2 100 % 07/03/22 1454  Vitals shown include unvalidated device data.  Last Pain:  Vitals:   07/03/22 0944  TempSrc:   PainSc: 0-No pain         Complications: No notable events documented.

## 2022-07-03 NOTE — Telephone Encounter (Signed)
Shouldn't need this for 90 days

## 2022-07-03 NOTE — Brief Op Note (Signed)
   07/03/2022  2:29 PM  PATIENT:  Janet Chen  27 y.o. female  PRE-OPERATIVE DIAGNOSIS:  left knee patella instability, meniscal cyst  POST-OPERATIVE DIAGNOSIS:  left knee patella instability, meniscal cyst  PROCEDURE:  Procedure(s): LEFT KNEE ARTHROSCOPY, CYST DECOMPRESSION, DEBRIDEMENT, MEDIAL PATELLOFEMORAL LIGAMENT RECONSTRUCTION WITH HAMSTRING ALLOGRAFT  SURGEON:  Surgeon(s): Marlou Sa, Tonna Corner, MD  ASSISTANT: magnant pa  ANESTHESIA:   general  EBL: 15 ml    Total I/O In: 1000 [I.V.:1000] Out: -   BLOOD ADMINISTERED: none  DRAINS: none   LOCAL MEDICATIONS USED:  marcaine mso4 clonidine  SPECIMEN:  No Specimen  COUNTS:  YES  TOURNIQUET:   Total Tourniquet Time Documented: Thigh (Right) - 48 minutes Total: Thigh (Right) - 48 minutes   DICTATION: .Other Dictation: Dictation Number [id number]  PLAN OF CARE: Discharge to home after PACU  PATIENT DISPOSITION:  PACU - hemodynamically stable

## 2022-07-05 ENCOUNTER — Encounter (HOSPITAL_COMMUNITY): Payer: Self-pay | Admitting: Orthopedic Surgery

## 2022-07-11 ENCOUNTER — Encounter: Payer: Self-pay | Admitting: Orthopedic Surgery

## 2022-07-11 ENCOUNTER — Ambulatory Visit (INDEPENDENT_AMBULATORY_CARE_PROVIDER_SITE_OTHER): Payer: Medicaid Other | Admitting: Orthopedic Surgery

## 2022-07-11 ENCOUNTER — Telehealth: Payer: Self-pay | Admitting: Orthopedic Surgery

## 2022-07-11 DIAGNOSIS — M25362 Other instability, left knee: Secondary | ICD-10-CM

## 2022-07-11 MED ORDER — OXYCODONE HCL 5 MG PO TABS: 5.0000 mg | ORAL_TABLET | Freq: Three times a day (TID) | ORAL | 0 refills | Status: DC | PRN

## 2022-07-11 NOTE — Progress Notes (Signed)
   Post-Op Visit Note   Patient: Janet Chen           Date of Birth: [birth year]           MRN: [id number] Visit Date: 07/11/2022 PCP: Andria Frames, PA-C   Assessment & Plan:  Chief Complaint:  Chief Complaint  Patient presents with   Left Knee - Routine Post Op    07/03/22 (8d) Left Knee Arthroscopy, Cyst Decompression, Debridement, Medial Patellofemoral Ligament Reconstruction With Hamstring Allograft      Visit Diagnoses:  1. Patellar instability of left knee     Plan: [name] is a 27 year old patient who underwent left knee arthroscopy and debridement and MPFL reconstruction with hamstring autograft 07/03/2022.  She has been weightbearing as tolerated in knee immobilizer.  On examination she has excellent lateral mobility of the patella about 1-1/2-2 cm with solid endpoint.  No calf tenderness negative Homans.  Plan is to continue knee immobilizer for 1 week and then transition over to patellar brace.  Okay for easy range of motion to start in 1 week and we will see her back in 3 weeks for clinical recheck.  Oxycodone refilled.  Continue with aspirin for DVT prophylaxis.  Follow-Up Instructions: No follow-ups on file.   Orders:  No orders of the defined types were placed in this encounter.  Meds ordered this encounter  Medications   oxyCODONE (ROXICODONE) 5 MG immediate release tablet    Sig: Take 1 tablet (5 mg total) by mouth every 8 (eight) hours as needed for severe pain.    Dispense:  30 tablet    Refill:  0    Imaging: No results found.  PMFS History: There are no problems to display for this patient.  Past Medical History:  Diagnosis Date   Anxiety    Asthma    Bipolar disorder ([place])    Depression    Ectopic pregnancy    Insomnia     No family history on file.  Past Surgical History:  Procedure Laterality Date   Fallopian Tube Removal     MEDIAL PATELLOFEMORAL LIGAMENT REPAIR Left 07/03/2022   Procedure: LEFT KNEE ARTHROSCOPY, CYST  DECOMPRESSION, DEBRIDEMENT, MEDIAL PATELLOFEMORAL LIGAMENT RECONSTRUCTION WITH HAMSTRING ALLOGRAFT;  Surgeon: Meredith Pel, MD;  Location: [place];  Service: Orthopedics;  Laterality: Left;   Social History   Occupational History   Not on file  Tobacco Use   Smoking status: Never   Smokeless tobacco: Never  Vaping Use   Vaping Use: Every day  Substance and Sexual Activity   Alcohol use: Not Currently   Drug use: Not Currently   Sexual activity: Not Currently

## 2022-07-11 NOTE — Telephone Encounter (Signed)
Okay for return to sedentary work on Monday.  For 4 weeks

## 2022-07-11 NOTE — Telephone Encounter (Signed)
Patient called in stating she would like a return to work note stating her restrictions specifically  Light duty patient would like it emailed if not Mychart

## 2022-07-12 NOTE — Telephone Encounter (Signed)
Reg pat brace after knee immobilizer can get from [place] if she doesnot have

## 2022-07-12 NOTE — Telephone Encounter (Signed)
Yes

## 2022-07-13 ENCOUNTER — Encounter: Payer: Self-pay | Admitting: Orthopedic Surgery

## 2022-07-13 ENCOUNTER — Telehealth: Payer: Self-pay

## 2022-07-13 NOTE — Telephone Encounter (Signed)
Pls make sure f/u scheduled.

## 2022-07-13 NOTE — Telephone Encounter (Signed)
-----   Message from Meredith Pel, MD sent at 07/13/2022  7:16 AM EDT ----- Corrin Parker can you have her follow-up with [facility] in 3 weeks.  Thanks

## 2022-07-29 DIAGNOSIS — M25362 Other instability, left knee: Secondary | ICD-10-CM

## 2022-07-29 DIAGNOSIS — M23007 Cystic meniscus, unspecified meniscus, left knee: Secondary | ICD-10-CM

## 2022-08-01 ENCOUNTER — Ambulatory Visit (INDEPENDENT_AMBULATORY_CARE_PROVIDER_SITE_OTHER): Payer: Medicaid Other | Admitting: Orthopedic Surgery

## 2022-08-01 DIAGNOSIS — M25362 Other instability, left knee: Secondary | ICD-10-CM

## 2022-08-02 NOTE — Progress Notes (Signed)
   Post-Op Visit Note   Patient: Janet Chen           Date of Birth: [birth year]           MRN: [id number] Visit Date: 08/01/2022 PCP: Virgilio Belling, PA-C   Assessment & Plan:  Chief Complaint:  Chief Complaint  Patient presents with   Left Knee - Routine Post Op, Follow-up    07/03/22 (4w 1d) Left Knee Arthroscopy, Cyst Decompression, Debridement, Medial Patellofemoral Ligament Reconstruction With Hamstring Allograt     Visit Diagnoses:  1. Patellar instability of left knee     Plan: Janet Chen is a patient who is now about a month out left knee arthroscopy with cyst decompression MPFL reconstruction with hamstring allograft.  Doing well.  Full weightbearing with patella brace.  Minimal pain.  On examination she has full extension and flexion to 90.  Patella mobility is good to about 2 cm with a good endpoint.  No effusion.  Plan at this time is discontinue brace in 2 weeks.  Okay to work with the brace.  6-week return for final check.  Follow-Up Instructions: No follow-ups on file.   Orders:  No orders of the defined types were placed in this encounter.  No orders of the defined types were placed in this encounter.   Imaging: No results found.  PMFS History: Patient Active Problem List   Diagnosis Date Noted   Meniscal cyst, left 07/29/2022   Patellar instability of left knee 07/29/2022   Past Medical History:  Diagnosis Date   Anxiety    Asthma    Bipolar disorder ([facility])    Depression    Ectopic pregnancy    Insomnia     No family history on file.  Past Surgical History:  Procedure Laterality Date   Fallopian Tube Removal     MEDIAL PATELLOFEMORAL LIGAMENT REPAIR Left 07/03/2022   Procedure: LEFT KNEE ARTHROSCOPY, CYST DECOMPRESSION, DEBRIDEMENT, MEDIAL PATELLOFEMORAL LIGAMENT RECONSTRUCTION WITH HAMSTRING ALLOGRAFT;  Surgeon: Cammy Copa, MD;  Location: [facility];  Service: Orthopedics;  Laterality: Left;   Social History   Occupational  History   Not on file  Tobacco Use   Smoking status: Never   Smokeless tobacco: Never  Vaping Use   Vaping Use: Every day  Substance and Sexual Activity   Alcohol use: Not Currently   Drug use: Not Currently   Sexual activity: Not Currently

## 2022-08-04 ENCOUNTER — Encounter: Payer: Self-pay | Admitting: Orthopedic Surgery

## 2022-08-19 ENCOUNTER — Emergency Department (HOSPITAL_COMMUNITY): Payer: BC Managed Care – PPO

## 2022-08-19 ENCOUNTER — Encounter (HOSPITAL_COMMUNITY): Payer: Self-pay

## 2022-08-19 ENCOUNTER — Other Ambulatory Visit: Payer: Self-pay

## 2022-08-19 ENCOUNTER — Emergency Department (HOSPITAL_COMMUNITY)
Admission: EM | Admit: 2022-08-19 | Discharge: 2022-08-19 | Disposition: A | Discharge status: Home / Self Care | Payer: BC Managed Care – PPO | Attending: Emergency Medicine | Admitting: Emergency Medicine

## 2022-08-19 DIAGNOSIS — W1781XA Fall down embankment (hill), initial encounter: Secondary | ICD-10-CM | POA: Insufficient documentation

## 2022-08-19 DIAGNOSIS — S82032A Displaced transverse fracture of left patella, initial encounter for closed fracture: Secondary | ICD-10-CM | POA: Diagnosis not present

## 2022-08-19 DIAGNOSIS — S8992XA Unspecified injury of left lower leg, initial encounter: Secondary | ICD-10-CM | POA: Diagnosis present

## 2022-08-19 MED ORDER — ACETAMINOPHEN 500 MG PO TABS
1000.0000 mg | ORAL_TABLET | Freq: Once | ORAL | Status: AC
  Administered 2022-08-19: 1000 mg via ORAL
  Filled 2022-08-19: qty 2

## 2022-08-19 MED ORDER — OXYCODONE HCL 5 MG PO TABS
5.0000 mg | ORAL_TABLET | Freq: Once | ORAL | Status: AC
  Administered 2022-08-19: 5 mg via ORAL
  Filled 2022-08-19: qty 1

## 2022-08-19 MED ORDER — MORPHINE SULFATE 15 MG PO TABS: 7.5000 mg | ORAL_TABLET | ORAL | 0 refills | Status: DC | PRN

## 2022-08-19 NOTE — ED Triage Notes (Signed)
C/o left knee pain with possible dislocation after walking down a hill and hyperextended leg.  Left knee arthroscopy on 07/03/22.

## 2022-08-19 NOTE — ED Notes (Signed)
Spoke with Ortho Tech at [facility] and was advised that because this pt was just receiving a knee immobilizer that he would not have to come to our department to place it.

## 2022-08-19 NOTE — ED Provider Notes (Signed)
[place] EMERGENCY DEPARTMENT AT [facility] Provider Note   CSN: [id number] Arrival date & time: 08/19/22  1859     History  Chief Complaint  Patient presents with   Knee Pain    Janet Chen is a 27 y.o. female.  27 yo F with a chief complaint of left knee pain.  The patient was walking down a hill and felt like her knee suddenly gave out.  She had significant discomfort and swelling since.  This happened yesterday evening.  She has had ongoing pain and difficulty bearing weight today and came to the ED for evaluation.  She had a recent surgery for patellofemoral instability.  She is worried that maybe her kneecap is dislocated.   Knee Pain      Home Medications Prior to Admission medications   Medication Sig Start Date End Date Taking? Authorizing Provider  morphine (MSIR) 15 MG tablet Take 0.5 tablets (7.5 mg total) by mouth every 4 (four) hours as needed for severe pain. 08/19/22  Yes Melene Plan, DO  albuterol (VENTOLIN HFA) 108 (90 Base) MCG/ACT inhaler Inhale 2 puffs into the lungs every 6 (six) hours as needed for wheezing. 03/04/20   [provider]  busPIRone (BUSPAR) 10 MG tablet Take 10 mg by mouth 2 (two) times daily. 06/28/20   [provider]  celecoxib (CELEBREX) 100 MG capsule Take 1 capsule (100 mg total) by mouth 2 (two) times daily. 07/03/22 07/03/23  Magnant, Joycie Peek, PA-C  etonogestrel (NEXPLANON) 68 MG IMPL implant 1 each by Subdermal route continuous.    [provider]  FLUoxetine (PROZAC) 40 MG capsule Take 80 mg by mouth daily. 06/28/20   [provider]  methocarbamol (ROBAXIN) 500 MG tablet Take 1 tablet (500 mg total) by mouth every 8 (eight) hours as needed for muscle spasms. 07/03/22   Magnant, Charles L, PA-C  ondansetron (ZOFRAN) 4 MG tablet Take 1 tablet (4 mg total) by mouth every 6 (six) hours. Patient not taking: Reported on 02/15/2021 07/01/20   Horton, Clabe Seal, DO  oxyCODONE (ROXICODONE) 5 MG  immediate release tablet Take 1 tablet (5 mg total) by mouth every 8 (eight) hours as needed for severe pain. 07/11/22   Cammy Copa, MD  QUEtiapine (SEROQUEL) 400 MG tablet Take 400 mg by mouth at bedtime. 06/20/20   [provider]  QUEtiapine (SEROQUEL) 50 MG tablet Take 50 mg by mouth in the morning.    [provider]      Allergies    Lactose intolerance (gi)    Review of Systems   Review of Systems  Physical Exam Updated Vital Signs BP (!) 137/118 (BP Location: Right Arm)   Pulse (!) 112   Temp 99.4 F (37.4 C) (Oral)   Resp 20   Wt 67 kg   SpO2 99%   BMI 24.58 kg/m  Physical Exam Vitals and nursing note reviewed.  Constitutional:      General: She is not in acute distress.    Appearance: She is well-developed. She is not diaphoretic.  HENT:     Head: Normocephalic and atraumatic.  Eyes:     Pupils: Pupils are equal, round, and reactive to light.  Cardiovascular:     Rate and Rhythm: Normal rate and regular rhythm.     Heart sounds: No murmur heard.    No friction rub. No gallop.  Pulmonary:     Effort: Pulmonary effort is normal.     Breath sounds: No  wheezing or rales.  Abdominal:     General: There is no distension.     Palpations: Abdomen is soft.     Tenderness: There is no abdominal tenderness.  Musculoskeletal:        General: Swelling and tenderness present.     Cervical back: Normal range of motion and neck supple.     Comments: Significant edema to the left knee.  No obvious ligamentous laxity for me.  Due to the extensive edema it is difficult to palpate the patella.  She is unable to straight leg raise the leg off the bed.  Pulse motor and sensation intact distally.  Skin:    General: Skin is warm and dry.  Neurological:     Mental Status: She is alert and oriented to person, place, and time.  Psychiatric:        Behavior: Behavior normal.     ED Results / Procedures / Treatments   Labs (all labs ordered are listed,  but only abnormal results are displayed) Labs Reviewed - No data to display  EKG None  Radiology DG Knee Complete 4 Views Left  Result Date: 08/19/2022 CLINICAL DATA:  Knee pain. The patient reports recent medial patellofemoral ligament repair. EXAM: LEFT KNEE - COMPLETE 4+ VIEW COMPARISON:  Knee radiographs dated 06/06/2022. FINDINGS: There is a displaced fracture of the patella. The primary two fracture fragments are displaced 3.2 cm vertically with rotation of the inferior fracture fragment. There is significant associated soft tissue swelling. There is no joint dislocation. IMPRESSION: Displaced fracture of the patella. Electronically Signed   By: Romona Curls M.D.   On: 08/19/2022 19:57    Procedures Procedures    Medications Ordered in ED Medications  acetaminophen (TYLENOL) tablet 1,000 mg (has no administration in time range)  oxyCODONE (Oxy IR/ROXICODONE) immediate release tablet 5 mg (has no administration in time range)    ED Course/ Medical Decision Making/ A&P                             Medical Decision Making Amount and/or Complexity of Data Reviewed Radiology: ordered.  Risk OTC drugs. Prescription drug management.   27 yo F with a cc of left knee pain.  This happened last night and she was ambulating and felt like the knee just gave out.  Since then she has had a lot of pain and swelling and difficulty bearing weight.  She is status post medial patellofemoral ligament reconstruction with a hamstring allograft done by Dr. August Saucer on 5 March.  Will obtain a plain film of the knee.  Plain film independently interpreted by me with a transverse patella fracture with significant displacement.  I discussed this with Dr. Magnus Ivan, orthopedics.  Recommended knee immobilizer weight-bear as tolerated crutches and follow-up in the office likely tomorrow with Dr. August Saucer.  8:07 PM:  I have discussed the diagnosis/risks/treatment options with the patient and family.  Evaluation  and diagnostic testing in the emergency department does not suggest an emergent condition requiring admission or immediate intervention beyond what has been performed at this time.  They will follow up with Ortho. We also discussed returning to the ED immediately if new or worsening sx occur. We discussed the sx which are most concerning (e.g., sudden worsening pain, fever, inability to tolerate by mouth) that necessitate immediate return. Medications administered to the patient during their visit and any new prescriptions provided to the patient are listed below.  Medications  given during this visit Medications  acetaminophen (TYLENOL) tablet 1,000 mg (has no administration in time range)  oxyCODONE (Oxy IR/ROXICODONE) immediate release tablet 5 mg (has no administration in time range)     The patient appears reasonably screen and/or stabilized for discharge and I doubt any other medical condition or other [facility] requiring further screening, evaluation, or treatment in the ED at this time prior to discharge.          Final Clinical Impression(s) / ED Diagnoses Final diagnoses:  Closed displaced transverse fracture of left patella, initial encounter    Rx / DC Orders ED Discharge Orders          Ordered    morphine (MSIR) 15 MG tablet  Every 4 hours PRN        08/19/22 2005              Melene Plan, DO 08/19/22 2007

## 2022-08-19 NOTE — Discharge Instructions (Addendum)
Call the in the morning to be seen by your orthopedist. Take 4 over the counter ibuprofen tablets 3 times a day or 2 over-the-counter naproxen tablets twice a day for pain. Also take tylenol (2 extra strength) four times a day.   Then take the pain medicine if you feel like you need it. Narcotics do not help with the pain, they only make you care about it less.  You can become addicted to this, people may break into your house to steal it.  It will constipate you.  If you drive under the influence of this medicine you can get a DUI.

## 2022-08-21 ENCOUNTER — Observation Stay (HOSPITAL_COMMUNITY)
Admission: EM | Admit: 2022-08-21 | Discharge: 2022-08-22 | Disposition: A | Discharge status: Home / Self Care | Payer: Medicaid Other | Attending: Orthopedic Surgery | Admitting: Orthopedic Surgery

## 2022-08-21 ENCOUNTER — Observation Stay (HOSPITAL_COMMUNITY): Payer: Medicaid Other

## 2022-08-21 ENCOUNTER — Emergency Department (HOSPITAL_COMMUNITY): Payer: Medicaid Other | Admitting: Certified Registered"

## 2022-08-21 ENCOUNTER — Emergency Department (HOSPITAL_COMMUNITY): Payer: Medicaid Other

## 2022-08-21 ENCOUNTER — Other Ambulatory Visit: Payer: Self-pay

## 2022-08-21 ENCOUNTER — Encounter (HOSPITAL_COMMUNITY): Payer: Self-pay

## 2022-08-21 ENCOUNTER — Encounter (HOSPITAL_COMMUNITY)
Admission: EM | Disposition: A | Discharge status: Home / Self Care | Payer: Self-pay | Source: Home / Self Care | Attending: Emergency Medicine

## 2022-08-21 ENCOUNTER — Ambulatory Visit: Payer: Medicaid Other | Admitting: Orthopedic Surgery

## 2022-08-21 DIAGNOSIS — S52501A Unspecified fracture of the lower end of right radius, initial encounter for closed fracture: Secondary | ICD-10-CM | POA: Insufficient documentation

## 2022-08-21 DIAGNOSIS — W010XXA Fall on same level from slipping, tripping and stumbling without subsequent striking against object, initial encounter: Secondary | ICD-10-CM | POA: Diagnosis not present

## 2022-08-21 DIAGNOSIS — S62101D Fracture of unspecified carpal bone, right wrist, subsequent encounter for fracture with routine healing: Secondary | ICD-10-CM | POA: Diagnosis not present

## 2022-08-21 DIAGNOSIS — Z9889 Other specified postprocedural states: Secondary | ICD-10-CM

## 2022-08-21 DIAGNOSIS — J45909 Unspecified asthma, uncomplicated: Secondary | ICD-10-CM | POA: Diagnosis not present

## 2022-08-21 DIAGNOSIS — S82032G Displaced transverse fracture of left patella, subsequent encounter for closed fracture with delayed healing: Secondary | ICD-10-CM | POA: Diagnosis not present

## 2022-08-21 DIAGNOSIS — S52611A Displaced fracture of right ulna styloid process, initial encounter for closed fracture: Secondary | ICD-10-CM | POA: Insufficient documentation

## 2022-08-21 DIAGNOSIS — S62101A Fracture of unspecified carpal bone, right wrist, initial encounter for closed fracture: Secondary | ICD-10-CM

## 2022-08-21 HISTORY — PX: OPEN REDUCTION INTERNAL FIXATION (ORIF) DISTAL RADIAL FRACTURE: SHX5989

## 2022-08-21 HISTORY — PX: ORIF PATELLA: SHX5033

## 2022-08-21 LAB — SURGICAL PCR SCREEN
MRSA, PCR: NEGATIVE
Staphylococcus aureus: POSITIVE — AB

## 2022-08-21 LAB — PREGNANCY, URINE: Preg Test, Ur: NEGATIVE

## 2022-08-21 SURGERY — OPEN REDUCTION INTERNAL FIXATION (ORIF) PATELLA
Anesthesia: General | Site: Wrist | Laterality: Right

## 2022-08-21 MED ORDER — METHOCARBAMOL 500 MG PO TABS
500.0000 mg | ORAL_TABLET | Freq: Four times a day (QID) | ORAL | Status: DC | PRN
  Administered 2022-08-21: 500 mg via ORAL
  Filled 2022-08-21: qty 1

## 2022-08-21 MED ORDER — ACETAMINOPHEN 500 MG PO TABS
1000.0000 mg | ORAL_TABLET | Freq: Four times a day (QID) | ORAL | Status: DC
  Administered 2022-08-22 (×3): 1000 mg via ORAL
  Filled 2022-08-21 (×3): qty 2

## 2022-08-21 MED ORDER — CHLORHEXIDINE GLUCONATE 0.12 % MT SOLN: 15.0000 mL | Freq: Once | OROMUCOSAL | Status: AC

## 2022-08-21 MED ORDER — CELECOXIB 100 MG PO CAPS
100.0000 mg | ORAL_CAPSULE | Freq: Two times a day (BID) | ORAL | Status: DC
  Administered 2022-08-21 – 2022-08-22 (×2): 100 mg via ORAL
  Filled 2022-08-21 (×3): qty 1

## 2022-08-21 MED ORDER — 0.9 % SODIUM CHLORIDE (POUR BTL) OPTIME
TOPICAL | Status: DC | PRN
  Administered 2022-08-21 (×4): 1000 mL

## 2022-08-21 MED ORDER — ORAL CARE MOUTH RINSE: 15.0000 mL | Freq: Once | OROMUCOSAL | Status: AC

## 2022-08-21 MED ORDER — ONDANSETRON HCL 4 MG/2ML IJ SOLN
4.0000 mg | Freq: Once | INTRAMUSCULAR | Status: AC
  Administered 2022-08-21: 4 mg via INTRAVENOUS
  Filled 2022-08-21: qty 2

## 2022-08-21 MED ORDER — DEXAMETHASONE SODIUM PHOSPHATE 10 MG/ML IJ SOLN
INTRAMUSCULAR | Status: DC | PRN
  Administered 2022-08-21: 10 mg via INTRAVENOUS

## 2022-08-21 MED ORDER — MIDAZOLAM HCL 2 MG/2ML IJ SOLN
INTRAMUSCULAR | Status: AC
  Filled 2022-08-21: qty 2

## 2022-08-21 MED ORDER — VANCOMYCIN HCL 1000 MG IV SOLR
INTRAVENOUS | Status: DC | PRN
  Administered 2022-08-21: 1000 mg via TOPICAL
  Administered 2022-08-21: 1000 mg

## 2022-08-21 MED ORDER — ACETAMINOPHEN 325 MG PO TABS: 325.0000 mg | ORAL_TABLET | Freq: Four times a day (QID) | ORAL | Status: DC | PRN

## 2022-08-21 MED ORDER — METHOCARBAMOL 1000 MG/10ML IJ SOLN: 500.0000 mg | Freq: Four times a day (QID) | INTRAVENOUS | Status: DC | PRN

## 2022-08-21 MED ORDER — MIDAZOLAM HCL 2 MG/2ML IJ SOLN
1.0000 mg | Freq: Once | INTRAMUSCULAR | Status: DC | PRN
  Filled 2022-08-21: qty 2

## 2022-08-21 MED ORDER — CHLORHEXIDINE GLUCONATE 0.12 % MT SOLN
OROMUCOSAL | Status: AC
  Administered 2022-08-21: 15 mL via OROMUCOSAL
  Filled 2022-08-21: qty 15

## 2022-08-21 MED ORDER — ROCURONIUM BROMIDE 10 MG/ML (PF) SYRINGE
PREFILLED_SYRINGE | INTRAVENOUS | Status: AC
  Filled 2022-08-21: qty 10

## 2022-08-21 MED ORDER — CEFAZOLIN SODIUM-DEXTROSE 1-4 GM/50ML-% IV SOLN
1.0000 g | Freq: Three times a day (TID) | INTRAVENOUS | Status: DC
  Administered 2022-08-22: 1 g via INTRAVENOUS
  Filled 2022-08-21: qty 50

## 2022-08-21 MED ORDER — MORPHINE SULFATE (PF) 4 MG/ML IV SOLN
4.0000 mg | Freq: Once | INTRAVENOUS | Status: AC
  Administered 2022-08-21: 4 mg via INTRAVENOUS
  Filled 2022-08-21: qty 1

## 2022-08-21 MED ORDER — PROPOFOL 10 MG/ML IV BOLUS
INTRAVENOUS | Status: AC
  Filled 2022-08-21: qty 20

## 2022-08-21 MED ORDER — BUSPIRONE HCL 10 MG PO TABS
10.0000 mg | ORAL_TABLET | Freq: Two times a day (BID) | ORAL | Status: DC
  Administered 2022-08-21 – 2022-08-22 (×2): 10 mg via ORAL
  Filled 2022-08-21 (×2): qty 1

## 2022-08-21 MED ORDER — BUPIVACAINE HCL (PF) 0.25 % IJ SOLN
INTRAMUSCULAR | Status: AC
  Filled 2022-08-21: qty 30

## 2022-08-21 MED ORDER — DEXMEDETOMIDINE HCL IN NACL 80 MCG/20ML IV SOLN
INTRAVENOUS | Status: AC
  Filled 2022-08-21: qty 20

## 2022-08-21 MED ORDER — SODIUM CHLORIDE 0.9 % IV SOLN: INTRAVENOUS | Status: AC

## 2022-08-21 MED ORDER — FENTANYL CITRATE PF 50 MCG/ML IJ SOSY
50.0000 ug | PREFILLED_SYRINGE | Freq: Once | INTRAMUSCULAR | Status: AC
  Administered 2022-08-21: 50 ug via INTRAVENOUS
  Filled 2022-08-21: qty 1

## 2022-08-21 MED ORDER — ONDANSETRON HCL 4 MG/2ML IJ SOLN
INTRAMUSCULAR | Status: AC
  Filled 2022-08-21: qty 2

## 2022-08-21 MED ORDER — FENTANYL CITRATE (PF) 250 MCG/5ML IJ SOLN
INTRAMUSCULAR | Status: DC | PRN
  Administered 2022-08-21: 50 ug via INTRAVENOUS
  Administered 2022-08-21: 100 ug via INTRAVENOUS
  Administered 2022-08-21 (×2): 50 ug via INTRAVENOUS

## 2022-08-21 MED ORDER — DEXMEDETOMIDINE HCL IN NACL 80 MCG/20ML IV SOLN
INTRAVENOUS | Status: DC | PRN
  Administered 2022-08-21 (×2): 8 ug via BUCCAL
  Administered 2022-08-21: 12 ug via BUCCAL

## 2022-08-21 MED ORDER — CLONIDINE HCL (ANALGESIA) 100 MCG/ML EP SOLN
EPIDURAL | Status: AC
  Filled 2022-08-21: qty 10

## 2022-08-21 MED ORDER — VANCOMYCIN HCL 1000 MG IV SOLR
INTRAVENOUS | Status: AC
  Filled 2022-08-21: qty 20

## 2022-08-21 MED ORDER — BUPIVACAINE HCL (PF) 0.25 % IJ SOLN
INTRAMUSCULAR | Status: DC | PRN
  Administered 2022-08-21: 20 mL

## 2022-08-21 MED ORDER — CHLORHEXIDINE GLUCONATE 4 % EX SOLN
60.0000 mL | Freq: Once | CUTANEOUS | Status: AC
  Administered 2022-08-21: 4 via TOPICAL

## 2022-08-21 MED ORDER — LACTATED RINGERS IV SOLN: INTRAVENOUS | Status: DC | PRN

## 2022-08-21 MED ORDER — HYDROMORPHONE HCL 1 MG/ML IJ SOLN
INTRAMUSCULAR | Status: DC | PRN
  Administered 2022-08-21 (×2): .5 mg via INTRAVENOUS

## 2022-08-21 MED ORDER — LACTATED RINGERS IV SOLN: INTRAVENOUS | Status: DC

## 2022-08-21 MED ORDER — ETOMIDATE 2 MG/ML IV SOLN
10.0000 mg | Freq: Once | INTRAVENOUS | Status: DC | PRN
  Filled 2022-08-21: qty 10

## 2022-08-21 MED ORDER — MIDAZOLAM HCL 2 MG/2ML IJ SOLN
INTRAMUSCULAR | Status: DC | PRN
  Administered 2022-08-21: 2 mg via INTRAVENOUS

## 2022-08-21 MED ORDER — HYDROMORPHONE HCL 1 MG/ML IJ SOLN: 0.5000 mg | INTRAMUSCULAR | Status: DC | PRN

## 2022-08-21 MED ORDER — LIDOCAINE 2% (20 MG/ML) 5 ML SYRINGE
INTRAMUSCULAR | Status: AC
  Filled 2022-08-21: qty 5

## 2022-08-21 MED ORDER — CEFAZOLIN SODIUM-DEXTROSE 2-4 GM/100ML-% IV SOLN
2.0000 g | INTRAVENOUS | Status: AC
  Administered 2022-08-21 (×2): 2 g via INTRAVENOUS
  Filled 2022-08-21: qty 100

## 2022-08-21 MED ORDER — MIDAZOLAM HCL 2 MG/2ML IJ SOLN
INTRAMUSCULAR | Status: DC | PRN
  Administered 2022-08-21: 1 mg via INTRAVENOUS

## 2022-08-21 MED ORDER — DOCUSATE SODIUM 100 MG PO CAPS
100.0000 mg | ORAL_CAPSULE | Freq: Two times a day (BID) | ORAL | Status: DC
  Administered 2022-08-21 – 2022-08-22 (×2): 100 mg via ORAL
  Filled 2022-08-21 (×2): qty 1

## 2022-08-21 MED ORDER — PROMETHAZINE HCL 25 MG/ML IJ SOLN
6.2500 mg | INTRAMUSCULAR | Status: AC | PRN
  Administered 2022-08-21 (×2): 12.5 mg via INTRAVENOUS

## 2022-08-21 MED ORDER — HYDROMORPHONE HCL 1 MG/ML IJ SOLN
INTRAMUSCULAR | Status: AC
  Filled 2022-08-21: qty 0.5

## 2022-08-21 MED ORDER — CELECOXIB 200 MG PO CAPS
200.0000 mg | ORAL_CAPSULE | Freq: Once | ORAL | Status: AC
  Administered 2022-08-21: 200 mg via ORAL
  Filled 2022-08-21: qty 1

## 2022-08-21 MED ORDER — ALBUTEROL SULFATE (2.5 MG/3ML) 0.083% IN NEBU: 2.5000 mg | INHALATION_SOLUTION | Freq: Four times a day (QID) | RESPIRATORY_TRACT | Status: DC | PRN

## 2022-08-21 MED ORDER — PROPOFOL 10 MG/ML IV BOLUS
INTRAVENOUS | Status: DC | PRN
  Administered 2022-08-21: 20 mg via INTRAVENOUS

## 2022-08-21 MED ORDER — ONDANSETRON HCL 4 MG/2ML IJ SOLN: 4.0000 mg | Freq: Four times a day (QID) | INTRAMUSCULAR | Status: DC | PRN

## 2022-08-21 MED ORDER — FLUOXETINE HCL 20 MG PO CAPS
80.0000 mg | ORAL_CAPSULE | Freq: Every day | ORAL | Status: DC
  Administered 2022-08-21 – 2022-08-22 (×2): 80 mg via ORAL
  Filled 2022-08-21 (×2): qty 4

## 2022-08-21 MED ORDER — ONDANSETRON HCL 4 MG/2ML IJ SOLN
INTRAMUSCULAR | Status: DC | PRN
  Administered 2022-08-21: 4 mg via INTRAVENOUS

## 2022-08-21 MED ORDER — PROPOFOL 10 MG/ML IV BOLUS
INTRAVENOUS | Status: DC | PRN
  Administered 2022-08-21: 200 mg via INTRAVENOUS
  Administered 2022-08-21: 20 mg via INTRAVENOUS

## 2022-08-21 MED ORDER — METOCLOPRAMIDE HCL 5 MG/ML IJ SOLN: 5.0000 mg | Freq: Three times a day (TID) | INTRAMUSCULAR | Status: DC | PRN

## 2022-08-21 MED ORDER — POVIDONE-IODINE 10 % EX SWAB
2.0000 | Freq: Once | CUTANEOUS | Status: AC
  Administered 2022-08-21: 2 via TOPICAL

## 2022-08-21 MED ORDER — OXYCODONE HCL 5 MG PO TABS: 5.0000 mg | ORAL_TABLET | Freq: Once | ORAL | Status: DC | PRN

## 2022-08-21 MED ORDER — FENTANYL CITRATE (PF) 100 MCG/2ML IJ SOLN: 25.0000 ug | INTRAMUSCULAR | Status: DC | PRN

## 2022-08-21 MED ORDER — ACETAMINOPHEN 500 MG PO TABS
1000.0000 mg | ORAL_TABLET | Freq: Once | ORAL | Status: AC
  Administered 2022-08-21: 1000 mg via ORAL
  Filled 2022-08-21: qty 2

## 2022-08-21 MED ORDER — DEXAMETHASONE SODIUM PHOSPHATE 10 MG/ML IJ SOLN
INTRAMUSCULAR | Status: AC
  Filled 2022-08-21: qty 1

## 2022-08-21 MED ORDER — ONDANSETRON HCL 4 MG PO TABS: 4.0000 mg | ORAL_TABLET | Freq: Four times a day (QID) | ORAL | Status: DC | PRN

## 2022-08-21 MED ORDER — TRANEXAMIC ACID-NACL 1000-0.7 MG/100ML-% IV SOLN
1000.0000 mg | INTRAVENOUS | Status: AC
  Administered 2022-08-21: 1000 mg via INTRAVENOUS
  Filled 2022-08-21: qty 100

## 2022-08-21 MED ORDER — LIDOCAINE 2% (20 MG/ML) 5 ML SYRINGE
INTRAMUSCULAR | Status: DC | PRN
  Administered 2022-08-21: 60 mg via INTRAVENOUS

## 2022-08-21 MED ORDER — ROCURONIUM BROMIDE 100 MG/10ML IV SOLN
INTRAVENOUS | Status: DC | PRN
  Administered 2022-08-21 (×2): 20 mg via INTRAVENOUS
  Administered 2022-08-21: 60 mg via INTRAVENOUS

## 2022-08-21 MED ORDER — PROMETHAZINE HCL 25 MG/ML IJ SOLN
INTRAMUSCULAR | Status: AC
  Filled 2022-08-21: qty 1

## 2022-08-21 MED ORDER — METOCLOPRAMIDE HCL 5 MG PO TABS: 5.0000 mg | ORAL_TABLET | Freq: Three times a day (TID) | ORAL | Status: DC | PRN

## 2022-08-21 MED ORDER — FENTANYL CITRATE (PF) 250 MCG/5ML IJ SOLN
INTRAMUSCULAR | Status: AC
  Filled 2022-08-21: qty 5

## 2022-08-21 MED ORDER — ETOMIDATE 2 MG/ML IV SOLN
INTRAVENOUS | Status: DC | PRN
  Administered 2022-08-21: 5 mg via INTRAVENOUS

## 2022-08-21 MED ORDER — ETOMIDATE 2 MG/ML IV SOLN
INTRAVENOUS | Status: DC | PRN
  Administered 2022-08-21: 10 mg via INTRAVENOUS

## 2022-08-21 MED ORDER — OXYCODONE HCL 5 MG/5ML PO SOLN: 5.0000 mg | Freq: Once | ORAL | Status: DC | PRN

## 2022-08-21 MED ORDER — MORPHINE SULFATE (PF) 4 MG/ML IV SOLN
INTRAVENOUS | Status: AC
  Filled 2022-08-21: qty 2

## 2022-08-21 MED ORDER — OXYCODONE HCL 5 MG PO TABS
5.0000 mg | ORAL_TABLET | ORAL | Status: DC | PRN
  Administered 2022-08-21 – 2022-08-22 (×4): 10 mg via ORAL
  Filled 2022-08-21 (×4): qty 2

## 2022-08-21 MED ORDER — ASPIRIN 81 MG PO CHEW
81.0000 mg | CHEWABLE_TABLET | Freq: Two times a day (BID) | ORAL | Status: DC
  Administered 2022-08-22: 81 mg via ORAL
  Filled 2022-08-21: qty 1

## 2022-08-21 MED ORDER — SUGAMMADEX SODIUM 200 MG/2ML IV SOLN
INTRAVENOUS | Status: DC | PRN
  Administered 2022-08-21: 150 mg via INTRAVENOUS

## 2022-08-21 MED ORDER — QUETIAPINE FUMARATE 50 MG PO TABS
50.0000 mg | ORAL_TABLET | Freq: Every morning | ORAL | Status: DC
  Administered 2022-08-22: 50 mg via ORAL
  Filled 2022-08-21: qty 2
  Filled 2022-08-21: qty 1

## 2022-08-21 MED ORDER — OXYCODONE HCL 5 MG PO TABS: 5.0000 mg | ORAL_TABLET | Freq: Once | ORAL | Status: DC

## 2022-08-21 MED ORDER — MORPHINE SULFATE (PF) 4 MG/ML IV SOLN
INTRAVENOUS | Status: DC | PRN
  Administered 2022-08-21: 12.75 mL

## 2022-08-21 MED ORDER — QUETIAPINE FUMARATE 200 MG PO TABS
400.0000 mg | ORAL_TABLET | Freq: Every day | ORAL | Status: DC
  Administered 2022-08-21: 400 mg via ORAL
  Filled 2022-08-21: qty 4
  Filled 2022-08-21 (×2): qty 2

## 2022-08-21 SURGICAL SUPPLY — 117 items
BAG COUNTER SPONGE SURGICOUNT (BAG) ×2 IMPLANT
BANDAGE ESMARK 6X9 LF (GAUZE/BANDAGES/DRESSINGS) ×2 IMPLANT
BIT DRILL 2.2 SS TIBIAL (BIT) IMPLANT
BIT DRILL 2.6 CANN (BIT) IMPLANT
BLADE CLIPPER SURG (BLADE) ×2 IMPLANT
BLADE SURG 10 STRL SS (BLADE) ×2 IMPLANT
BNDG COHESIVE 4X5 TAN STRL (GAUZE/BANDAGES/DRESSINGS) ×2 IMPLANT
BNDG ELASTIC 4INX 5YD STR LF (GAUZE/BANDAGES/DRESSINGS) IMPLANT
BNDG ELASTIC 4X5.8 VLCR STR LF (GAUZE/BANDAGES/DRESSINGS) ×2 IMPLANT
BNDG ELASTIC 6INX 5YD STR LF (GAUZE/BANDAGES/DRESSINGS) IMPLANT
BNDG ELASTIC 6X5.8 VLCR STR LF (GAUZE/BANDAGES/DRESSINGS) ×2 IMPLANT
BNDG ESMARK 4X9 LF (GAUZE/BANDAGES/DRESSINGS) IMPLANT
BNDG ESMARK 6X9 LF (GAUZE/BANDAGES/DRESSINGS) ×2
BNDG GAUZE DERMACEA FLUFF 4 (GAUZE/BANDAGES/DRESSINGS) IMPLANT
CLSR STERI-STRIP ANTIMIC 1/2X4 (GAUZE/BANDAGES/DRESSINGS) IMPLANT
CORD BIPOLAR FORCEPS 12FT (ELECTRODE) ×2 IMPLANT
COVER SURGICAL LIGHT HANDLE (MISCELLANEOUS) ×2 IMPLANT
CUFF TOURN SGL QUICK 18X4 (TOURNIQUET CUFF) ×2 IMPLANT
CUFF TOURN SGL QUICK 24 (TOURNIQUET CUFF)
CUFF TOURN SGL QUICK 34 (TOURNIQUET CUFF) ×2
CUFF TOURN SGL QUICK 42 (TOURNIQUET CUFF) IMPLANT
CUFF TRNQT CYL 24X4X16.5-23 (TOURNIQUET CUFF) IMPLANT
CUFF TRNQT CYL 34X4.125X (TOURNIQUET CUFF) IMPLANT
DRAIN TLS ROUND 10FR (DRAIN) IMPLANT
DRAPE C-ARM 42X72 X-RAY (DRAPES) ×2 IMPLANT
DRAPE INCISE IOBAN 66X45 STRL (DRAPES) IMPLANT
DRAPE OEC MINIVIEW 54X84 (DRAPES) IMPLANT
DRAPE U-SHAPE 47X51 STRL (DRAPES) ×2 IMPLANT
DRSG ADAPTIC 3X8 NADH LF (GAUZE/BANDAGES/DRESSINGS) ×2 IMPLANT
DRSG AQUACEL AG ADV 3.5X 4 (GAUZE/BANDAGES/DRESSINGS) IMPLANT
DRSG AQUACEL AG ADV 3.5X 6 (GAUZE/BANDAGES/DRESSINGS) IMPLANT
DRSG EMULSION OIL 3X3 NADH (GAUZE/BANDAGES/DRESSINGS) ×2 IMPLANT
DURAPREP 26ML APPLICATOR (WOUND CARE) ×2 IMPLANT
ELECT REM PT RETURN 9FT ADLT (ELECTROSURGICAL) ×2
ELECTRODE REM PT RTRN 9FT ADLT (ELECTROSURGICAL) ×2 IMPLANT
FACESHIELD WRAPAROUND (MASK) ×2 IMPLANT
FACESHIELD WRAPAROUND OR TEAM (MASK) ×2 IMPLANT
FIBERTAPE 2 W/STRL NDL 17 (SUTURE) IMPLANT
GAUZE PAD ABD 8X10 STRL (GAUZE/BANDAGES/DRESSINGS) ×2 IMPLANT
GAUZE SPONGE 4X4 12PLY STRL (GAUZE/BANDAGES/DRESSINGS) IMPLANT
GAUZE XEROFORM 1X8 LF (GAUZE/BANDAGES/DRESSINGS) IMPLANT
GLOVE BIO SURGEON ST LM GN SZ9 (GLOVE) ×2 IMPLANT
GLOVE BIO SURGEON STRL SZ8 (GLOVE) ×4 IMPLANT
GLOVE BIOGEL PI IND STRL 8 (GLOVE) ×2 IMPLANT
GLOVE ECLIPSE 8.0 STRL XLNG CF (GLOVE) ×2 IMPLANT
GOWN STRL REUS W/ TWL LRG LVL3 (GOWN DISPOSABLE) ×4 IMPLANT
GOWN STRL REUS W/ TWL XL LVL3 (GOWN DISPOSABLE) ×4 IMPLANT
GOWN STRL REUS W/TWL LRG LVL3 (GOWN DISPOSABLE) ×4
GOWN STRL REUS W/TWL XL LVL3 (GOWN DISPOSABLE) ×4
GUIDEWIRE 1.35MM  DUAL TROCAR (WIRE) ×6
GUIDEWIRE 1.35MM DUAL TROCAR (WIRE) IMPLANT
IMMOBILIZER KNEE 22 UNIV (SOFTGOODS) IMPLANT
K-WIRE 1.6 (WIRE) ×6
K-WIRE FX5X1.6XNS BN SS (WIRE) ×6
KIT BASIN OR (CUSTOM PROCEDURE TRAY) ×2 IMPLANT
KIT TURNOVER KIT B (KITS) ×2 IMPLANT
KWIRE FX5X1.6XNS BN SS (WIRE) IMPLANT
MANIFOLD NEPTUNE II (INSTRUMENTS) ×2 IMPLANT
NDL 22X1.5 STRL (OR ONLY) (MISCELLANEOUS) IMPLANT
NDL TAPERED W/ NITINOL LOOP (MISCELLANEOUS) IMPLANT
NEEDLE 22X1.5 STRL (OR ONLY) (MISCELLANEOUS) ×2 IMPLANT
NEEDLE TAPERED W/ NITINOL LOOP (MISCELLANEOUS) ×2 IMPLANT
NS IRRIG 1000ML POUR BTL (IV SOLUTION) ×2 IMPLANT
PACK ORTHO EXTREMITY (CUSTOM PROCEDURE TRAY) ×2 IMPLANT
PAD ARMBOARD 7.5X6 YLW CONV (MISCELLANEOUS) ×4 IMPLANT
PAD CAST 3X4 CTTN HI CHSV (CAST SUPPLIES) ×2 IMPLANT
PAD CAST 4YDX4 CTTN HI CHSV (CAST SUPPLIES) ×4 IMPLANT
PADDING CAST COTTON 3X4 STRL (CAST SUPPLIES)
PADDING CAST COTTON 4X4 STRL (CAST SUPPLIES)
PASSER SUT SWANSON 36MM LOOP (INSTRUMENTS) IMPLANT
PEG LOCKING SMOOTH 2.2X15 (Peg) IMPLANT
PEG LOCKING SMOOTH 2.2X16 (Screw) IMPLANT
PEG LOCKING SMOOTH 2.2X18 (Peg) IMPLANT
PEG LOCKING SMOOTH 2.2X20 (Screw) IMPLANT
PLATE NARROW DVR RIGHT (Plate) IMPLANT
SCREW 4X36MM CANN LO-PRO (Screw) IMPLANT
SCREW BLUNT TIP 4MMX32 (Screw) IMPLANT
SCREW BN 14X2.7XNONLOCK 3 LD (Screw) IMPLANT
SCREW LOCKING 2.7X13MM (Screw) IMPLANT
SCREW LOCKING 2.7X15MM (Screw) IMPLANT
SCREW MULTI DIRECTIONAL 2.7X18 (Screw) IMPLANT
SCREW NLOCK 2.7X14 (Screw) ×2 IMPLANT
SCREW NONLOCK 2.7X20MM (Screw) IMPLANT
SPIKE FLUID TRANSFER (MISCELLANEOUS) IMPLANT
SPLINT PLASTER CAST FAST 4X15 (CAST SUPPLIES) IMPLANT
SPONGE T-LAP 4X18 ~~LOC~~+RFID (SPONGE) ×4 IMPLANT
STAPLER VISISTAT 35W (STAPLE) ×2 IMPLANT
STOCKINETTE IMPERVIOUS 9X36 MD (GAUZE/BANDAGES/DRESSINGS) ×2 IMPLANT
STRIP CLOSURE SKIN 1/2X4 (GAUZE/BANDAGES/DRESSINGS) ×2 IMPLANT
SUCTION FRAZIER HANDLE 10FR (MISCELLANEOUS) ×2
SUCTION TUBE FRAZIER 10FR DISP (MISCELLANEOUS) ×2 IMPLANT
SUT 0 FIBERLOOP 38 BLUE TPR ND (SUTURE) ×2
SUT ETHILON 3 0 PS 1 (SUTURE) IMPLANT
SUT FIBERWIRE #2 38 REV NDL BL (SUTURE)
SUT MNCRL AB 3-0 PS2 27 (SUTURE) IMPLANT
SUT PROLENE 3 0 PS 1 (SUTURE) IMPLANT
SUT STEEL 5 V 56 M (SUTURE) ×2 IMPLANT
SUT VIC AB 0 CT1 27 (SUTURE) ×8
SUT VIC AB 0 CT1 27XBRD ANBCTR (SUTURE) ×2 IMPLANT
SUT VIC AB 1 CT1 36 (SUTURE) IMPLANT
SUT VIC AB 1 CTX 36 (SUTURE) ×2
SUT VIC AB 1 CTX36XBRD ANBCTRL (SUTURE) IMPLANT
SUT VIC AB 2-0 CT1 27 (SUTURE) ×6
SUT VIC AB 2-0 CT1 TAPERPNT 27 (SUTURE) ×4 IMPLANT
SUT VIC AB 2-0 CTB1 (SUTURE) IMPLANT
SUT VIC AB 3-0 X1 27 (SUTURE) IMPLANT
SUTURE 0 FIBERLP 38 BLU TPR ND (SUTURE) IMPLANT
SUTURE FIBERWR#2 38 REV NDL BL (SUTURE) IMPLANT
SYR 10ML LL (SYRINGE) IMPLANT
SYR CONTROL 10ML LL (SYRINGE) IMPLANT
SYSTEM CHEST DRAIN TLS 7FR (DRAIN) IMPLANT
TOWEL GREEN STERILE (TOWEL DISPOSABLE) ×2 IMPLANT
TOWEL GREEN STERILE FF (TOWEL DISPOSABLE) ×2 IMPLANT
TUBE CONNECTING 12X1/4 (SUCTIONS) ×2 IMPLANT
UNDERPAD 30X36 HEAVY ABSORB (UNDERPADS AND DIAPERS) ×2 IMPLANT
WATER STERILE IRR 1000ML POUR (IV SOLUTION) ×2 IMPLANT
YANKAUER SUCT BULB TIP NO VENT (SUCTIONS) IMPLANT

## 2022-08-21 NOTE — Procedures (Signed)
Procedure: Right wrist closed reduction   Indication: Right wrist fracture   Surgeon: Charma Igo, PA-C   Assist: None   Anesthesia: Etomodate via EDP   EBL: None   Complications: None   Findings: After risks/benefits explained patient desires to undergo procedure. Consent obtained and time out performed. Sedation given. Wrist reduced and splinted. Pt tolerated the procedure poorly, fought most of the way.       Freeman Caldron, PA-C Orthopedic Surgery [phone]

## 2022-08-21 NOTE — ED Provider Notes (Signed)
[place] EMERGENCY DEPARTMENT AT [facility] Provider Note   CSN: [id number] Arrival date & time: 08/21/22  2130     History No chief complaint on file.   Janet Chen is a 27 y.o. female presenting today with an injury to the right wrist.  She recently fractured her patella and was using a knee immobilizer and crutches and lost her balance and fell onto her outstretched right hand.  No numbness or tingling.  Clear deformity and severe pain.  HPI     Home Medications Prior to Admission medications   Medication Sig Start Date End Date Taking? Authorizing Provider  albuterol (VENTOLIN HFA) 108 (90 Base) MCG/ACT inhaler Inhale 2 puffs into the lungs every 6 (six) hours as needed for wheezing. 03/04/20   [provider]  busPIRone (BUSPAR) 10 MG tablet Take 10 mg by mouth 2 (two) times daily. 06/28/20   [provider]  celecoxib (CELEBREX) 100 MG capsule Take 1 capsule (100 mg total) by mouth 2 (two) times daily. 07/03/22 07/03/23  Magnant, Joycie Peek, PA-C  etonogestrel (NEXPLANON) 68 MG IMPL implant 1 each by Subdermal route continuous.    [provider]  FLUoxetine (PROZAC) 40 MG capsule Take 80 mg by mouth daily. 06/28/20   [provider]  methocarbamol (ROBAXIN) 500 MG tablet Take 1 tablet (500 mg total) by mouth every 8 (eight) hours as needed for muscle spasms. 07/03/22   Magnant, Charles L, PA-C  morphine (MSIR) 15 MG tablet Take 0.5 tablets (7.5 mg total) by mouth every 4 (four) hours as needed for severe pain. 08/19/22   Melene Plan, DO  ondansetron (ZOFRAN) 4 MG tablet Take 1 tablet (4 mg total) by mouth every 6 (six) hours. Patient not taking: Reported on 02/15/2021 07/01/20   Horton, Clabe Seal, DO  oxyCODONE (ROXICODONE) 5 MG immediate release tablet Take 1 tablet (5 mg total) by mouth every 8 (eight) hours as needed for severe pain. 07/11/22   Cammy Copa, MD  QUEtiapine (SEROQUEL) 400 MG tablet Take 400 mg by mouth at  bedtime. 06/20/20   [provider]  QUEtiapine (SEROQUEL) 50 MG tablet Take 50 mg by mouth in the morning.    [provider]      Allergies    Lactose intolerance (gi)    Review of Systems   Review of Systems  Physical Exam Updated Vital Signs BP 113/79 (BP Location: Left Arm)   Pulse 93   Temp 99 F (37.2 C) (Oral)   Resp 20   SpO2 97%  Physical Exam Vitals and nursing note reviewed.  Constitutional:      Appearance: Normal appearance.  HENT:     Head: Normocephalic and atraumatic.  Eyes:     General: No scleral icterus.    Conjunctiva/sclera: Conjunctivae normal.  Pulmonary:     Effort: Pulmonary effort is normal. No respiratory distress.  Musculoskeletal:     Comments: Full range of motion at the elbow, MCPs, PIPs and DIPs.  Normal cap refill.  Strong and present radial pulse.  No sensation deficit  Skin:    Findings: No rash.  Neurological:     Mental Status: She is alert.  Psychiatric:        Mood and Affect: Mood normal.     ED Results / Procedures / Treatments   Labs (all labs ordered are listed, but only abnormal results are displayed) Labs Reviewed - No data to display  EKG None  Radiology DG Knee Complete  4 Views Left  Result Date: 08/19/2022 CLINICAL DATA:  Knee pain. The patient reports recent medial patellofemoral ligament repair. EXAM: LEFT KNEE - COMPLETE 4+ VIEW COMPARISON:  Knee radiographs dated 06/06/2022. FINDINGS: There is a displaced fracture of the patella. The primary two fracture fragments are displaced 3.2 cm vertically with rotation of the inferior fracture fragment. There is significant associated soft tissue swelling. There is no joint dislocation. IMPRESSION: Displaced fracture of the patella. Electronically Signed   By: Romona Curls M.D.   On: 08/19/2022 19:57    Procedures Procedures   Medications Ordered in ED Medications  oxyCODONE (Oxy IR/ROXICODONE) immediate release tablet 5 mg ( Oral MAR Hold 08/21/22  1530)  etomidate (AMIDATE) injection 10 mg ( Intravenous MAR Hold 08/21/22 1530)  midazolam (VERSED) injection 1 mg ( Intravenous MAR Hold 08/21/22 1530)  chlorhexidine (HIBICLENS) 4 % liquid 4 Application (has no administration in time range)  povidone-iodine 10 % swab 2 Application (has no administration in time range)  ceFAZolin (ANCEF) IVPB 2g/100 mL premix (has no administration in time range)  tranexamic acid (CYKLOKAPRON) IVPB 1,000 mg (has no administration in time range)  lactated ringers infusion (has no administration in time range)  chlorhexidine (PERIDEX) 0.12 % solution 15 mL (has no administration in time range)    Or  Oral care mouth rinse (has no administration in time range)  chlorhexidine (PERIDEX) 0.12 % solution (has no administration in time range)  morphine (PF) 4 MG/ML injection 4 mg (4 mg Intravenous Given 08/21/22 1004)  ondansetron (ZOFRAN) injection 4 mg (4 mg Intravenous Given 08/21/22 1003)  fentaNYL (SUBLIMAZE) injection 50 mcg (50 mcg Intravenous Given 08/21/22 1150)  morphine (PF) 4 MG/ML injection 4 mg (4 mg Intravenous Given 08/21/22 1334)  morphine (PF) 4 MG/ML injection 4 mg (4 mg Intravenous Given 08/21/22 1352)    ED Course/ Medical Decision Making/ A&P Clinical Course as of 08/21/22 1417  Tue Aug 21, 2022  0936 Patient continues to yell in pain.  Will need to be medicated prior to x-ray.  Nursing made aware [MR]  1349 Per patient's RN her IV may have been dislodged during administration of morphine.  Patient reports that she continued to have no change in her pain.  Repeat morphine ordered [MR]    Clinical Course User Index [MR] Mali Eppard, Gabriel Cirri, PA-C                             Medical Decision Making Amount and/or Complexity of Data Reviewed Radiology: ordered.  Risk Prescription drug management.   Imaging: Agree with radiology that patient has a comminuted fracture that needs reduction on x-ray  Treatment: Sedation performed by Dr.  Particia Nearing, reduction performed by Earney Hamburg with orthopedics. Morphine, oxycodone, fentanyl and repeat morphine  Plan: Patient will go to the OR with Dr. August Saucer today for treatment of her patellar fracture as well as her left wrist injury   Final Clinical Impression(s) / ED Diagnoses Final diagnoses:  Closed displaced fracture of styloid process of right ulna, initial encounter  Closed fracture of distal end of right radius, unspecified fracture morphology, initial encounter    Rx / DC Orders ED Discharge Orders     None        Saddie Benders, PA-C 08/21/22 1536    Jacalyn Lefevre, MD 08/21/22 1558

## 2022-08-21 NOTE — ED Triage Notes (Signed)
Patient has recent knee injury and this am knee gave way and she fell trying to catch herself with right hand. Obvious fracture with severe pain and swelling. Patient with normal sensation and positive distal pulse

## 2022-08-21 NOTE — ED Provider Notes (Signed)
  Physical Exam  BP 130/72   Pulse 75   Temp 98 F (36.7 C) (Oral)   Resp 20   Ht  (1.651 m)   Wt 63.5 kg   LMP 08/19/2022   SpO2 99%   BMI 23.30 kg/m   Physical Exam  Procedures  .Sedation  Date/Time: 08/21/2022 3:59 PM  Performed by: Jacalyn Lefevre, MD Authorized by: Jacalyn Lefevre, MD   Consent:    Consent obtained:  Written   Consent given by:  Patient   Alternatives discussed:  Analgesia without sedation Universal protocol:    Immediately prior to procedure, a time out was called: yes     Patient identity confirmed:  Verbally with patient Indications:    Procedure performed:  Fracture reduction   Procedure necessitating sedation performed by:  Different physician Pre-sedation assessment:    Time since last food or drink:  5   ASA classification: class 1 - normal, healthy patient     Mallampati score:  I - soft palate, uvula, fauces, pillars visible   Pre-sedation assessments completed and reviewed: airway patency, cardiovascular function, hydration status, mental status, nausea/vomiting, pain level, respiratory function and temperature   Immediate pre-procedure details:    Reassessment: Patient reassessed immediately prior to procedure     Reviewed: vital signs     Verified: bag valve mask available, emergency equipment available, intubation equipment available, IV patency confirmed, oxygen available and reversal medications available   Procedure details (see MAR for exact dosages):    Preoxygenation:  Room air   Sedation:  Etomidate, midazolam and propofol   Intended level of sedation: deep   Analgesia:  Morphine   Intra-procedure monitoring:  Blood pressure monitoring, cardiac monitor, continuous capnometry, continuous pulse oximetry, frequent LOC assessments and frequent vital sign checks   Intra-procedure events: none     Total Provider sedation time (minutes):  30 Post-procedure details:    Attendance: Constant attendance by certified staff until  patient recovered     Recovery: Patient returned to pre-procedure baseline     Patient is stable for discharge or admission: yes     Procedure completion:  Tolerated well, no immediate complications Comments:     Despite 15 mg of etomidate, 1 mg of versed, and 20 mg propofol, pt still not asleep.  However, PA Leotis Shames was able to reduce and splint wrist.   ED Course / MDM   Clinical Course as of 08/21/22 1559  Tue Aug 21, 2022  4098 Patient continues to yell in pain.  Will need to be medicated prior to x-ray.  Nursing made aware [MR]  1349 Per patient's RN her IV may have been dislodged during administration of morphine.  Patient reports that she continued to have no change in her pain.  Repeat morphine ordered [MR]    Clinical Course User Index [MR] [place], Gabriel Cirri, PA-C   Medical Decision Making Amount and/or Complexity of Data Reviewed Radiology: ordered.  Risk Prescription drug management.          Jacalyn Lefevre, MD 08/21/22 [id number]

## 2022-08-21 NOTE — Op Note (Unsigned)
NAME: Janet Chen, Janet Chen MEDICAL RECORD NO: [id number] ACCOUNT NO: [id number] DATE OF BIRTH: 1995-06-24 FACILITY: [facility] LOCATION: [id number] PHYSICIAN: Graylin Shiver. August Saucer, MD  Operative Report   DATE OF PROCEDURE: 08/21/2022  PREOPERATIVE DIAGNOSES:  Left transverse patellar fracture, right distal radius fracture.  POSTOPERATIVE DIAGNOSES:  Left transverse patellar fracture, right distal radius fracture.  PROCEDURE: 1.  Open reduction and internal fixation left transverse patellar fracture. 2. Open reduction and internal fixation comminuted distal radius fracture on the right.  SURGEON:  Graylin Shiver. August Saucer, MD  ASSISTANT:  Karenann Cai, PA  IMPLANTS UTILIZED: Included Arthrex screws for the patella oversewn with SutureTape and Biomet plate for the distal radius.  INDICATIONS:  The patient is a 27 year old patient who fell today and fractured her left patella and right distal radius.  Presents for operative management after explanation of risks and benefits.  DESCRIPTION OF PROCEDURE:  The patient was brought to the operating room where general endotracheal anesthesia was induced.  Preoperative antibiotics administered.  Timeout was called.  Left leg was prescrubbed with alcohol and Betadine, allowed to air  dry.  Prepped with DuraPrep solution and draped in sterile manner.  Ioban used to cover the operative field.  Leg was elevated with the Esmarch wrap.  Tourniquet was inflated.  After calling timeout, anterior approach to the knee was made incorporating  the prior incision.  Skin and subcutaneous tissue were sharply divided.  The fascia was exposed, preserved for later closure over the fixation.  The 2 fracture ends were irrigated and visualized.  The patient's MPFL reconstruction had pulled away from  the onlay grafting suture.  The loop, however, was still intact and was reincorporated at the conclusion of the case.  Thorough irrigation was performed.  Fracture was reduced and held with  clamps and K-wires were then placed across the fracture.   Reduction confirmed in the AP and lateral planes under fluoroscopy.  At this time, two cannulated screws were placed at the appropriate distance and the fracture was well reduced.  Arthrex SutureTape was then passed in figure-of-eight fashion through the  2 screws and that gave a very nice secure repair of the fracture.  At this time, thorough irrigation was performed.  The retinaculum was closed.  The allograft was reattached to the patella using 0 FiberWire suture.  We incorporated that into the soft  tissue on the medial aspect of the patella and also put it underneath the SutureTape for added fixation.  All in all, a very stable repair was reestablished.  Tourniquet released at this time, thorough irrigation was performed.  Skin edges were  anesthetized using Marcaine, morphine, clonidine.  The fascia was then closed over the repair.  Retinaculum was closed also in the medial and lateral side using #1 Vicryl suture.  Next, skin was closed using 0 Vicryl suture, 2-0 Vicryl suture, and 3-0  Monocryl.  Steri-Strips and Aquacel dressing applied.  Next, attention directed towards the distal radius.  The right arm was prescrubbed with alcohol and Betadine, allowed to air dry, prepped with DuraPrep solution and draped in sterile manner.  Ioban  used to cover the operative field.  Arm was elevated and exsanguinated with the Esmarch wrap for a total of 71 minutes.  Incision made beginning at the wrist flexion crease and extending proximally along the FCR tendon.  FCR tendon sheath was incised  both on the volar and dorsal aspect.  This was mobilized radially with the artery and vein.  The median nerve was protected.  The quadratus muscle was incised off the radial aspect of the distal radius and the fracture site was visualized and exposed.   Next, the fracture was reduced.  Plate was applied.  Screws and pegs were then placed into the plate through the  fracture, which was reduced with restoration of height, inclination, and tilt.  Good reduction was achieved.  Screws were placed proximally.   At this time, thorough irrigation was performed with multiple liters of irrigating solution. Vancomycin powder was placed.  Tourniquet was released.  Bleeding points encountered controlled using bipolar electrocautery.  Skin was closed using 3-0 Vicryl  suture and 3-0 nylon with Aquacel placed along with volar wrist splint.  The patient then transferred to the recovery room in stable condition.  Luke's assistance was required at all times for retraction, opening, closing, mobilization of tissue.  His  assistance was a medical necessity.   VAI D: 08/21/2022 8:34:44 pm T: 08/21/2022 10:08:00 pm  JOB: [id number]

## 2022-08-21 NOTE — Anesthesia Preprocedure Evaluation (Addendum)
Anesthesia Evaluation  Patient identified by MRN, date of birth, ID band Patient awake    Reviewed: Allergy & Precautions, NPO status , Patient's Chart, lab work & pertinent test results  History of Anesthesia Complications Negative for: history of anesthetic complications  Airway Mallampati: I  TM Distance: >3 FB Neck ROM: Full    Dental  (+) Dental Advisory Given, Teeth Intact   Pulmonary asthma , Current SmokerPatient did not abstain from smoking.   Pulmonary exam normal        Cardiovascular negative cardio ROS Normal cardiovascular exam     Neuro/Psych  PSYCHIATRIC DISORDERS Anxiety Depression Bipolar Disorder   negative neurological ROS     GI/Hepatic negative GI ROS, Neg liver ROS,,,  Endo/Other  negative endocrine ROS    Renal/GU negative Renal ROS     Musculoskeletal negative musculoskeletal ROS (+)    Abdominal   Peds  Hematology negative hematology ROS (+)   Anesthesia Other Findings   Reproductive/Obstetrics  Hx ectopic pregnancy s/p salpingectomy                              Anesthesia Physical Anesthesia Plan  ASA: 2  Anesthesia Plan: General   Post-op Pain Management: Tylenol PO (pre-op)* and Celebrex PO (pre-op)*   Induction: Intravenous  PONV Risk Score and Plan: 2 and Treatment may vary due to age or medical condition, Ondansetron, Dexamethasone and Midazolam  Airway Management Planned: Oral ETT  Additional Equipment: None  Intra-op Plan:   Post-operative Plan: Extubation in OR  Informed Consent: I have reviewed the patients History and Physical, chart, labs and discussed the procedure including the risks, benefits and alternatives for the proposed anesthesia with the patient or authorized representative who has indicated his/her understanding and acceptance.     Dental advisory given  Plan Discussed with: CRNA and Anesthesiologist  Anesthesia Plan  Comments:        Anesthesia Quick Evaluation

## 2022-08-21 NOTE — Plan of Care (Signed)
  Problem: Education: Goal: Knowledge of General Education information will improve Description Including pain rating scale, medication(s)/side effects and non-pharmacologic comfort measures Outcome: Progressing   Problem: Health Behavior/Discharge Planning: Goal: Ability to manage health-related needs will improve Outcome: Progressing   

## 2022-08-21 NOTE — ED Notes (Addendum)
RT responded for conscious sedation. Ambu bag and suction set up at bedside. Pt placed on ETCO2 monitoring and 2L nasal cannula for procedure. Pt weaned back to room upon completion of procedure. RT will continue to be available as needed.

## 2022-08-21 NOTE — Brief Op Note (Signed)
   08/21/2022  8:28 PM  PATIENT:  Janet Chen  27 y.o. female  PRE-OPERATIVE DIAGNOSIS:  Left Patella Fracture, Right Distal Radius Fracture  POST-OPERATIVE DIAGNOSIS:  Left Patella Fracture, Right Distal Radius Fracture  PROCEDURE:  Procedure(s): OPEN REDUCTION INTERNAL FIXATION (ORIF) PATELLA OPEN REDUCTION INTERNAL FIXATION (ORIF) DISTAL RADIUS FRACTURE  SURGEON:  Surgeon(s): August Saucer, Corrie Mckusick, MD  ASSISTANT: magnant pa  ANESTHESIA:   general  EBL: 50 ml    Total I/O In: 1000 [I.V.:1000] Out: -   BLOOD ADMINISTERED: none  DRAINS: none   LOCAL MEDICATIONS USED:  vanco marcaine mso4 clonidine   SPECIMEN:  No Specimen  COUNTS:  YES  TOURNIQUET:   Total Tourniquet Time Documented: Thigh (Left) - 96 minutes Total: Thigh (Left) - 96 minutes  Upper Arm (Right) - 71 minutes Total: Upper Arm (Right) - 71 minutes   DICTATION: .Other Dictation: Dictation Number [id number]  PLAN OF CARE: Admit for overnight observation  PATIENT DISPOSITION:  PACU - hemodynamically stable

## 2022-08-21 NOTE — Anesthesia Procedure Notes (Signed)
Procedure Name: Intubation Date/Time: 08/21/2022 4:32 PM  Performed by: Alwyn Ren, CRNAPre-anesthesia Checklist: Patient identified, Emergency Drugs available, Suction available and Patient being monitored Patient Re-evaluated:Patient Re-evaluated prior to induction Oxygen Delivery Method: Circle system utilized Preoxygenation: Pre-oxygenation with 100% oxygen Induction Type: IV induction Ventilation: Mask ventilation without difficulty Laryngoscope Size: Miller and 2 Grade View: Grade I Tube type: Oral Tube size: 7.0 mm Number of attempts: 1 Airway Equipment and Method: Stylet and Oral airway Placement Confirmation: ETT inserted through vocal cords under direct vision, positive ETCO2 and breath sounds checked- equal and bilateral Secured at: 21 cm Tube secured with: Tape Dental Injury: Teeth and Oropharynx as per pre-operative assessment

## 2022-08-21 NOTE — Transfer of Care (Signed)
Immediate Anesthesia Transfer of Care Note  Patient: Janet Chen  Procedure(s) Performed: OPEN REDUCTION INTERNAL FIXATION (ORIF) PATELLA (Left: Knee) OPEN REDUCTION INTERNAL FIXATION (ORIF) DISTAL RADIUS FRACTURE (Right: Wrist)  Patient Location: PACU  Anesthesia Type:General  Level of Consciousness: awake, alert , and oriented  Airway & Oxygen Therapy: Patient Spontanous Breathing  Post-op Assessment: Report given to RN and Post -op Vital signs reviewed and stable  Post vital signs: Reviewed and stable  Last Vitals:  Vitals Value Taken Time  BP 135/78 08/21/22 2046  Temp 36.7 C 08/21/22 2045  Pulse 103 08/21/22 2048  Resp 19 08/21/22 2047  SpO2 96 % 08/21/22 2048  Vitals shown include unvalidated device data.  Last Pain:  Vitals:   08/21/22 1547  TempSrc:   PainSc: 10-Worst pain ever         Complications: No notable events documented.

## 2022-08-21 NOTE — Consult Note (Signed)
Reason for Consult:Right wrist fx Referring Physician: Jacalyn Lefevre Time called: 1008 Time at bedside: 1025   Janet Chen is an 27 y.o. female.  HPI: Janet Chen was ambulating this morning and lost her balance in her KI and fell. She put out her right hand to brake her fall and had immediate wrist pain and a deformity. She was brought to the ED where x-rays showed a wrist fx and orthopedic surgery was consulted. 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  Past Medical History:  Diagnosis Date   Anxiety    Asthma    Bipolar disorder    Depression    Ectopic pregnancy    Insomnia     Past Surgical History:  Procedure Laterality Date   Fallopian Tube Removal     MEDIAL PATELLOFEMORAL LIGAMENT REPAIR Left 07/03/2022   Procedure: LEFT KNEE ARTHROSCOPY, CYST DECOMPRESSION, DEBRIDEMENT, MEDIAL PATELLOFEMORAL LIGAMENT RECONSTRUCTION WITH HAMSTRING ALLOGRAFT;  Surgeon: Cammy Copa, MD;  Location: [facility] OR;  Service: Orthopedics;  Laterality: Left;    History reviewed. No pertinent family history.  Social History:  reports that she has never smoked. She has never used smokeless tobacco. She reports that she does not currently use alcohol. She reports that she does not currently use drugs.  Allergies:  Allergies  Allergen Reactions   Lactose Intolerance (Gi) Other (See Comments)    Upset stomach    Medications: I have reviewed the patient's current medications.  No results found for this or any previous visit (from the past 48 hour(s)).  DG Wrist Complete Right  Result Date: 08/21/2022 CLINICAL DATA:  Fall.  Wrist pain. EXAM: RIGHT WRIST - COMPLETE 3+ VIEW COMPARISON:  None Available. FINDINGS: Comminuted fracture of the distal radial metaphysis noted with 7-8 mm posterior displacement of the distal fracture fragment and apex anterior angulation. No definite extension to the articular surface visible on the provided images. Associated ulnar styloid  fracture evident. IMPRESSION: Comminuted fracture of the distal radial metaphysis with 7-8 mm posterior displacement of the distal fracture fragment and apex anterior angulation. Ulnar styloid fracture. Electronically Signed   By: Kennith Center M.D.   On: 08/21/2022 10:02   DG Knee Complete 4 Views Left  Result Date: 08/19/2022 CLINICAL DATA:  Knee pain. The patient reports recent medial patellofemoral ligament repair. EXAM: LEFT KNEE - COMPLETE 4+ VIEW COMPARISON:  Knee radiographs dated 06/06/2022. FINDINGS: There is a displaced fracture of the patella. The primary two fracture fragments are displaced 3.2 cm vertically with rotation of the inferior fracture fragment. There is significant associated soft tissue swelling. There is no joint dislocation. IMPRESSION: Displaced fracture of the patella. Electronically Signed   By: Romona Curls M.D.   On: 08/19/2022 19:57    Review of Systems  HENT:  Negative for ear discharge, ear pain, hearing loss and tinnitus.   Eyes:  Negative for photophobia and pain.  Respiratory:  Negative for cough and shortness of breath.   Cardiovascular:  Negative for chest pain.  Gastrointestinal:  Negative for abdominal pain, nausea and vomiting.  Genitourinary:  Negative for dysuria, flank pain, frequency and urgency.  Musculoskeletal:  Positive for arthralgias (Right wrist). Negative for back pain, myalgias and neck pain.  Neurological:  Negative for dizziness and headaches.  Hematological:  Does not bruise/bleed easily.  Psychiatric/Behavioral:  The patient is not nervous/anxious.    Blood pressure 113/79, pulse 93, temperature 99 F (37.2 C), temperature source Oral, resp. rate 20, SpO2 97 %. Physical Exam  Constitutional:      General: She is not in acute distress.    Appearance: She is well-developed. She is not diaphoretic.  HENT:     Head: Normocephalic and atraumatic.  Eyes:     General: No scleral icterus.       Right eye: No discharge.        Left eye:  No discharge.     Conjunctiva/sclera: Conjunctivae normal.  Cardiovascular:     Rate and Rhythm: Normal rate and regular rhythm.  Pulmonary:     Effort: Pulmonary effort is normal. No respiratory distress.  Musculoskeletal:     Cervical back: Normal range of motion.     Comments: Right shoulder, elbow, wrist, digits- no skin wounds, wrist severe TTP, deformity, no instability, no blocks to motion  Sens  Ax/R/M/U intact  Mot   Ax/ R/ PIN/ M/ AIN/ U intact  Rad 2+  Skin:    General: Skin is warm and dry.  Neurological:     Mental Status: She is alert.  Psychiatric:        Mood and Affect: Mood normal.        Behavior: Behavior normal.     Assessment/Plan: Right wrist fx -- Plan CR under CS via EDP. Will get CT to follow.     Freeman Caldron, PA-C Orthopedic Surgery [phone] 08/21/2022, 10:29 AM

## 2022-08-22 ENCOUNTER — Encounter (HOSPITAL_COMMUNITY): Payer: Self-pay | Admitting: Orthopedic Surgery

## 2022-08-22 LAB — IRON AND TIBC
Iron: 14 ug/dL — ABNORMAL LOW (ref 28–170)
Saturation Ratios: 5 % — ABNORMAL LOW (ref 10.4–31.8)
TIBC: 281 ug/dL (ref 250–450)
UIBC: 267 ug/dL

## 2022-08-22 LAB — VITAMIN D 25 HYDROXY (VIT D DEFICIENCY, FRACTURES): Vit D, 25-Hydroxy: 37.84 ng/mL (ref 30–100)

## 2022-08-22 MED ORDER — CELECOXIB 100 MG PO CAPS: 100.0000 mg | ORAL_CAPSULE | Freq: Two times a day (BID) | ORAL | 0 refills | Status: DC

## 2022-08-22 MED ORDER — DOCUSATE SODIUM 100 MG PO CAPS: 100.0000 mg | ORAL_CAPSULE | Freq: Two times a day (BID) | ORAL | 0 refills | Status: DC

## 2022-08-22 MED ORDER — ACETAMINOPHEN 325 MG PO TABS: 325.0000 mg | ORAL_TABLET | Freq: Four times a day (QID) | ORAL | 0 refills | Status: DC | PRN

## 2022-08-22 MED ORDER — OXYCODONE HCL 5 MG PO TABS: 5.0000 mg | ORAL_TABLET | ORAL | 0 refills | Status: DC | PRN

## 2022-08-22 MED ORDER — METHOCARBAMOL 500 MG PO TABS: 500.0000 mg | ORAL_TABLET | Freq: Four times a day (QID) | ORAL | 0 refills | Status: DC | PRN

## 2022-08-22 MED ORDER — CHLORHEXIDINE GLUCONATE CLOTH 2 % EX PADS
6.0000 | MEDICATED_PAD | Freq: Every day | CUTANEOUS | Status: DC
  Administered 2022-08-22: 6 via TOPICAL

## 2022-08-22 MED ORDER — ASPIRIN 81 MG PO CHEW: 81.0000 mg | CHEWABLE_TABLET | Freq: Two times a day (BID) | ORAL | 0 refills | Status: DC

## 2022-08-22 MED ORDER — MUPIROCIN 2 % EX OINT
1.0000 | TOPICAL_OINTMENT | Freq: Two times a day (BID) | CUTANEOUS | Status: DC
  Administered 2022-08-22 (×2): 1 via NASAL
  Filled 2022-08-22 (×2): qty 22

## 2022-08-22 NOTE — Progress Notes (Signed)
Discharge instructions given. Patient verbalized understanding and all questions were answered.  ?

## 2022-08-22 NOTE — Anesthesia Postprocedure Evaluation (Signed)
Anesthesia Post Note  Patient: Janet Chen  Procedure(s) Performed: OPEN REDUCTION INTERNAL FIXATION (ORIF) PATELLA (Left: Knee) OPEN REDUCTION INTERNAL FIXATION (ORIF) DISTAL RADIUS FRACTURE (Right: Wrist)     Patient location during evaluation: PACU Anesthesia Type: General Level of consciousness: awake and alert Pain management: pain level controlled Vital Signs Assessment: post-procedure vital signs reviewed and stable Respiratory status: spontaneous breathing, nonlabored ventilation, respiratory function stable and patient connected to nasal cannula oxygen Cardiovascular status: blood pressure returned to baseline and stable Postop Assessment: no apparent nausea or vomiting Anesthetic complications: no  No notable events documented.  Last Vitals:  Vitals:   08/21/22 2145 08/21/22 2204  BP: 113/62 123/74  Pulse: 90 87  Resp: 14 14  Temp: 36.8 C 36.8 C  SpO2: 94% 98%    Last Pain:  Vitals:   08/21/22 2252  TempSrc:   PainSc: 7                  Shelton Silvas

## 2022-08-22 NOTE — Progress Notes (Signed)
  Subjective: Patient stable.  Right wrist hurts more than left knee.   Objective: Vital signs in last 24 hours: Temp:  [98 F (36.7 C)-99 F (37.2 C)] 98.5 F (36.9 C) (04/24 1610) Pulse Rate:  [68-149] 83 (04/24 0632) Resp:  [11-27] 16 (04/24 9604) BP: (109-141)/(55-103) 110/65 (04/24 0632) SpO2:  [94 %-100 %] 100 % (04/24 5409) Weight:  [63.5 kg] 63.5 kg (04/23 1530)  Intake/Output from previous day: 04/23 0701 - 04/24 0700 In: 1913.4 [I.V.:1863.4; IV Piggyback:50] Out: 20 [Blood:20] Intake/Output this shift: No intake/output data recorded.  Exam:  Intact pulses distally Dorsiflexion/Plantar flexion intact No cellulitis present Compartment soft  Labs: No results for input(s): "HGB" in the last 72 hours. No results for input(s): "WBC", "RBC", "HCT", "PLT" in the last 72 hours. No results for input(s): "NA", "K", "CL", "CO2", "BUN", "CREATININE", "GLUCOSE", "CALCIUM" in the last 72 hours. No results for input(s): "LABPT", "INR" in the last 72 hours.  Assessment/Plan: Plan at this time is physical therapy to work with patient weightbearing as tolerated with the left leg and knee immobilizer.  She needs a walker rolling walker with platform for the right hand so she does not bear weight through the wrist.  She should be good to go this afternoon.  Would like to check a few labs before she goes as well including vitamin D and then iron and calcium.   Marrianne Mood Diahn Waidelich 08/22/2022, 7:49 AM

## 2022-08-22 NOTE — Evaluation (Signed)
Physical Therapy Evaluation Patient Details Name: Janet Chen MRN: [id number] DOB: 13-Mar-1996 Today's Date: 08/22/2022  History of Present Illness  Pt is a 27 y/o F admitted on 08/21/22 after presenting with c/o R wrist injury. Pt recently fractured L patella & was using KI & crutches when she lost her balance & fell onto outstretched R hand. Pt is s/p ORIF of L patella & ORIF of R distal radius fx. PMH: anxiety, asthma, bipolar disorder, depression, insomnia, ectopic pregnancy  Clinical Impression  Pt seen for PT evaluation with pt agreeable. Pt reports she lives with her sister & prior to original L knee injury she was independent without AD & working. Pt denies pain on this date & PT educates her on weight bearing precautions. Pt is able to ambulate room<>gym with R PFRW & mod I. Pt negotiates stairs with LUE HHA min assist & good demo of compensatory pattern. Pt is eager to d/c home & voices comfort with d/c today - team notified.   Recommendations for follow up therapy are one component of a multi-disciplinary discharge planning process, led by the attending physician.  Recommendations may be updated based on patient status, additional functional criteria and insurance authorization.  Follow Up Recommendations       Assistance Recommended at Discharge PRN  Patient can return home with the following  Assistance with cooking/housework;Help with stairs or ramp for entrance    Equipment Recommendations  (right platform RW)  Recommendations for Other Services       Functional Status Assessment Patient has had a recent decline in their functional status and demonstrates the ability to make significant improvements in function in a reasonable and predictable amount of time.     Precautions / Restrictions Precautions Precautions: None Required Braces or Orthoses: Knee Immobilizer - Left Knee Immobilizer - Left: On at all times Restrictions Weight Bearing Restrictions: Yes RUE  Weight Bearing: Non weight bearing (okay to use platform walker) LLE Weight Bearing: Weight bearing as tolerated (no knee flexion)      Mobility  Bed Mobility Overal bed mobility: Independent             General bed mobility comments: supine>sit    Transfers Overall transfer level: Independent Equipment used: None               General transfer comment: Pt able to transfer STS without AD    Ambulation/Gait Ambulation/Gait assistance: Modified independent (Device/Increase time) Gait Distance (Feet): 150 Feet (+ 150 ft) Assistive device: Right platform walker Gait Pattern/deviations: Decreased stance time - left, Decreased weight shift to left Gait velocity: slightly decreased        Stairs Stairs: Yes Stairs assistance: Min assist Stair Management: No rails, Step to pattern, Forwards Number of Stairs: 10 (6" + 3") General stair comments: PT educates pt on compensatory pattern. Pt able to negotiate stairs holding to PT's hand with pt's L hand to simulate pt holding to sister for support. Pt even elects to negotiate ~2 steps without UE support without LOB.  Wheelchair Mobility    Modified Rankin (Stroke Patients Only)       Balance Overall balance assessment: Mild deficits observed, not formally tested                                           Pertinent Vitals/Pain Pain Assessment Pain Assessment: No/denies pain    Home  Living Family/patient expects to be discharged to:: Private residence Living Arrangements: Other relatives (sister) Available Help at Discharge: Family Type of Home: House Home Access: Stairs to enter Entrance Stairs-Rails:  (no rails but has posts she can hold to) Secretary/administrator of Steps: 3-4   Home Layout: One level Home Equipment: Crutches      Prior Function Prior Level of Function : Independent/Modified Independent;Working/employed;Driving                     Hand Dominance         Extremity/Trunk Assessment   Upper Extremity Assessment Upper Extremity Assessment: RUE deficits/detail RUE Deficits / Details: R wrist in ace wrap    Lower Extremity Assessment Lower Extremity Assessment: LLE deficits/detail LLE Deficits / Details: LLE in KI, pt able to dorsiflex L ankle       Communication   Communication: No difficulties  Cognition Arousal/Alertness: Awake/alert Behavior During Therapy: WFL for tasks assessed/performed Overall Cognitive Status: Within Functional Limits for tasks assessed                                          General Comments      Exercises     Assessment/Plan    PT Assessment Patient needs continued PT services  PT Problem List Decreased balance;Decreased activity tolerance;Decreased mobility;Decreased knowledge of use of DME;Decreased knowledge of precautions       PT Treatment Interventions Therapeutic exercise;DME instruction;Gait training;Balance training;Functional mobility training;Patient/family education;Therapeutic activities;Neuromuscular re-education;Stair training;Modalities    PT Goals (Current goals can be found in the Care Plan section)  Acute Rehab PT Goals Patient Stated Goal: d/c home PT Goal Formulation: With patient Time For Goal Achievement: 09/05/22 Potential to Achieve Goals: Good    Frequency Min 2X/week     Co-evaluation               AM-PAC PT "6 Clicks" Mobility  Outcome Measure Help needed turning from your back to your side while in a flat bed without using bedrails?: None Help needed moving from lying on your back to sitting on the side of a flat bed without using bedrails?: None Help needed moving to and from a bed to a chair (including a wheelchair)?: None Help needed standing up from a chair using your arms (e.g., wheelchair or bedside chair)?: None Help needed to walk in hospital room?: None Help needed climbing 3-5 steps with a railing? : A Little 6 Click Score:  23    End of Session Equipment Utilized During Treatment: Left knee immobilizer Activity Tolerance: Patient tolerated treatment well Patient left: in bed;with call bell/phone within reach Nurse Communication: Mobility status PT Visit Diagnosis: Muscle weakness (generalized) (M62.81);Other abnormalities of gait and mobility (R26.89)    Time: 1610-9604 PT Time Calculation (min) (ACUTE ONLY): 14 min   Charges:   PT Evaluation $PT Eval Low Complexity: 1 Low          Aleda Grana, PT, DPT 08/22/22, 9:34 AM   Sandi Mariscal 08/22/2022, 9:33 AM

## 2022-08-22 NOTE — Progress Notes (Signed)
Patient DME equipment delivered, patient teaching given. Patient states understanding. Transport called to discharge patient home with belonging with family.

## 2022-08-22 NOTE — TOC Transition Note (Signed)
Transition of Care [facility]) - CM/SW Discharge Note   Patient Details  Name: Janet Chen MRN: [id number] Date of Birth: 28-Aug-1995  [facility] CM/SW Contact:  Janae Bridgeman, RN Phone Number: 08/22/2022, 11:00 AM   Clinical Narrative:    CM called and spoke with the patient by phone for initial assessment and patient is discharging home with the sister once platform walker arrives to the hospital room.     I called Barbara Cower, CM with [employer] and Right Platform RW was ordered to be delivered to the hospital room.  Bedside nursing and patient are aware.  Patient can discharge home once DME arrives.   Final next level of care: Home/Self Care Barriers to Discharge: No Barriers Identified   Patient Goals and CMS Choice CMS Medicare.gov Compare Post Acute Care list provided to:: Patient Choice offered to / list presented to : Patient  Discharge Placement                         Discharge Plan and Services Additional resources added to the After Visit Summary for     Discharge Planning Services: CM Consult Post Acute Care Choice: Durable Medical Equipment          DME Arranged: Dan Humphreys platform (Right Platform RW) DME Agency: [employer] Date DME Agency Contacted: 08/22/22 Time DME Agency Contacted: 1058 Representative spoke with at DME Agency: Barbara Cower, CM with [employer]            Social Determinants of Health (SDOH) Interventions SDOH Screenings   Food Insecurity: No Food Insecurity (08/21/2022)  Housing: Low Risk  (08/21/2022)  Transportation Needs: No Transportation Needs (08/21/2022)  Utilities: Not At Risk (08/21/2022)  Tobacco Use: Low Risk  (08/21/2022)     Readmission Risk Interventions     No data to display

## 2022-08-22 NOTE — Progress Notes (Addendum)
    Durable Medical Equipment  (From admission, onward)           Start     Ordered   08/22/22 1022  For home use only DME Walker rolling  Once       Comments: Needs Right Platform Rolling walker  Question Answer Comment  Walker: With 5 Inch Wheels   Patient needs a walker to treat with the following condition Generalized weakness      08/22/22 1022

## 2022-08-22 NOTE — Progress Notes (Signed)
Orthopedic Tech Progress Note Patient Details:  Janet Chen March 17, 1996 [id number] [employer] Brace has been ordered from [facility]  Patient ID: [name], female   DOB: 1996-01-01, 27 y.o.   MRN: [id number]  [name] 08/22/2022, 10:19 AM

## 2022-08-24 LAB — CALCIUM, IONIZED: Calcium, Ionized, Serum: 4.7 mg/dL (ref 4.5–5.6)

## 2022-08-26 DIAGNOSIS — S62101A Fracture of unspecified carpal bone, right wrist, initial encounter for closed fracture: Secondary | ICD-10-CM

## 2022-08-26 DIAGNOSIS — S82032G Displaced transverse fracture of left patella, subsequent encounter for closed fracture with delayed healing: Secondary | ICD-10-CM

## 2022-08-27 ENCOUNTER — Other Ambulatory Visit (INDEPENDENT_AMBULATORY_CARE_PROVIDER_SITE_OTHER): Payer: BC Managed Care – PPO

## 2022-08-27 ENCOUNTER — Ambulatory Visit (INDEPENDENT_AMBULATORY_CARE_PROVIDER_SITE_OTHER): Payer: Medicaid Other | Admitting: Surgical

## 2022-08-27 ENCOUNTER — Encounter: Payer: Self-pay | Admitting: Surgical

## 2022-08-27 DIAGNOSIS — M25531 Pain in right wrist: Secondary | ICD-10-CM | POA: Diagnosis not present

## 2022-08-27 DIAGNOSIS — M25362 Other instability, left knee: Secondary | ICD-10-CM | POA: Diagnosis not present

## 2022-08-27 NOTE — Progress Notes (Signed)
Post-Op Visit Note   Patient: Janet Chen           Date of Birth: [birth year]           MRN: [id number] Visit Date: 08/27/2022 PCP: Virgilio Belling, PA-C   Assessment & Plan:  Chief Complaint:  Chief Complaint  Patient presents with   Right Wrist - Routine Post Op   Left Knee - Routine Post Op   Visit Diagnoses:  1. Patellar instability of left knee   2. Pain in right wrist     Plan: Patient is a 27 year old female who presents s/p left patellar ORIF and right distal radius ORIF on 08/21/2022.  She states that she is doing well overall.  Not using her walker for ambulation assistance any longer.  She is full weightbearing with Bledsoe brace that is locked in extension.  She feels stable when she walks around.  No fevers or chills.  No chest pain, shortness of breath, calf pain.  Knee pain is overall controlled but she does describe some discomfort in the right wrist that she mostly localizes to the ulnar aspect of the wrist where the ulnar styloid fracture is.  She is trying to wean off pain medication and just take ibuprofen.  Taking aspirin for DVT prophylaxis.  On exam, patient has intact EPL, FPL, finger abduction, grip strength.  2+ radial pulse of the operative extremity.  Incision looks to be healing well with no active bleeding or drainage.  Sutures intact.  Left knee with small effusion.  Incision healing well on the anterior aspect of the left knee.  She is able to perform straight leg raise.  She has 0 degrees extension.  No calf tenderness.  Negative Homans' sign.  Palpable DP pulse of the left lower extremity.  No evidence of infection or dehiscence of either incision.  There is no numbness noted throughout the right hand.  She has radiographs demonstrating excellent alignment of the distal radius fracture with acceptable alignment of the patellar fracture.  There is little bit of increased gapping in the anterior aspect of the patella fracture but the mild  step-off of the articular surface seems acceptable.  Hardware remains intact.  Plan is to continue with knee locked in extension in Bledsoe brace.  Okay to weight-bear as tolerated in full extension.  Nonweightbearing to the right hand.  Return on Friday for suture removal from the right wrist.  Follow-up with Dr. August Saucer after that visit approximately 2 weeks from today.  No range of motion of the operative knee.  Reviewed her lab work from her hospital stay.  Though her vitamin D was 37, think that it would be good for her to take a small vitamin D supplement of 1000 to 2000 units a day.  Also should take iron supplement over-the-counter.  Follow-Up Instructions: No follow-ups on file.   Orders:  Orders Placed This Encounter  Procedures   XR KNEE 3 VIEW LEFT   XR Wrist 2 Views Right   No orders of the defined types were placed in this encounter.   Imaging: No results found.  PMFS History: Patient Active Problem List   Diagnosis Date Noted   Displaced transverse fracture of left patella, subsequent encounter for closed fracture with delayed healing 08/26/2022   Closed fracture of right wrist 08/26/2022   S/P knee surgery 08/21/2022   Meniscal cyst, left 07/29/2022   Patellar instability of left knee 07/29/2022   Past Medical History:  Diagnosis Date  Anxiety    Asthma    Bipolar disorder (HCC)    Depression    Ectopic pregnancy    Insomnia     No family history on file.  Past Surgical History:  Procedure Laterality Date   Fallopian Tube Removal     MEDIAL PATELLOFEMORAL LIGAMENT REPAIR Left 07/03/2022   Procedure: LEFT KNEE ARTHROSCOPY, CYST DECOMPRESSION, DEBRIDEMENT, MEDIAL PATELLOFEMORAL LIGAMENT RECONSTRUCTION WITH HAMSTRING ALLOGRAFT;  Surgeon: Cammy Copa, MD;  Location: [facility] OR;  Service: Orthopedics;  Laterality: Left;   OPEN REDUCTION INTERNAL FIXATION (ORIF) DISTAL RADIAL FRACTURE Right 08/21/2022   Procedure: OPEN REDUCTION INTERNAL FIXATION (ORIF) DISTAL RADIUS  FRACTURE;  Surgeon: Cammy Copa, MD;  Location: [facility] OR;  Service: Orthopedics;  Laterality: Right;   ORIF PATELLA Left 08/21/2022   Procedure: OPEN REDUCTION INTERNAL FIXATION (ORIF) PATELLA;  Surgeon: Cammy Copa, MD;  Location: [facility] OR;  Service: Orthopedics;  Laterality: Left;   Social History   Occupational History   Not on file  Tobacco Use   Smoking status: Never   Smokeless tobacco: Never  Vaping Use   Vaping Use: Every day  Substance and Sexual Activity   Alcohol use: Not Currently   Drug use: Not Currently   Sexual activity: Not Currently

## 2022-08-28 ENCOUNTER — Ambulatory Visit: Payer: BC Managed Care – PPO

## 2022-08-31 ENCOUNTER — Ambulatory Visit: Payer: BC Managed Care – PPO

## 2022-08-31 NOTE — Progress Notes (Unsigned)
Patient was here to have sutures removed from right wrist.  Sutures removed and steri-strips applied.

## 2022-09-06 NOTE — Discharge Summary (Signed)
Physician Discharge Summary      Patient ID: Janet Chen MRN: [id number] DOB/AGE: [birth year] 27 y.o.  Admit date: 08/21/2022 Discharge date: 08/22/2022  Admission Diagnoses:  Principal Problem:   S/P knee surgery Active Problems:   Displaced transverse fracture of left patella, subsequent encounter for closed fracture with delayed healing   Closed fracture of right wrist   Discharge Diagnoses:  Same  Surgeries: Procedure(s): OPEN REDUCTION INTERNAL FIXATION (ORIF) PATELLA OPEN REDUCTION INTERNAL FIXATION (ORIF) DISTAL RADIUS FRACTURE on 08/21/2022   Consultants: Treatment Team:  Cammy Copa, MD  Discharged Condition: [facility] Course: Janet Chen is an 27 y.o. female who was admitted 08/21/2022 with a chief complaint of knee pain and wrist pain, and found to have a diagnosis of left knee patellar fracture with right distal radius fracture.  They were brought to the operating room on 08/21/2022 and underwent the above named procedures.  Pt awoke from anesthesia without complication and was transferred to the floor. On POD1, patient's pain was overall controlled.  She had no red flag signs or symptoms.  After working well with physical therapy, she was discharged home.  Pt will f/u with Dr. August Saucer in clinic in ~2 weeks.   Antibiotics given:  Anti-infectives (From admission, onward)    Start     Dose/Rate Route Frequency Ordered Stop   08/22/22 0600  ceFAZolin (ANCEF) IVPB 2g/100 mL premix        2 g 200 mL/hr over 30 Minutes Intravenous On call to O.R. 08/21/22 1353 08/21/22 2005   08/22/22 0300  ceFAZolin (ANCEF) IVPB 1 g/50 mL premix  Status:  Discontinued        1 g 100 mL/hr over 30 Minutes Intravenous Every 8 hours 08/21/22 2204 08/22/22 1847   08/21/22 1726  vancomycin (VANCOCIN) powder  Status:  Discontinued          As needed 08/21/22 1726 08/21/22 2040     .  Recent vital signs:  Vitals:   08/22/22 0632 08/22/22 0815  BP: 110/65 (!)  101/57  Pulse: 83 73  Resp: 16   Temp: 98.5 F (36.9 C) 98.3 F (36.8 C)  SpO2: 100% 98%    Recent laboratory studies:  Results for orders placed or performed during the hospital encounter of 08/21/22  Surgical pcr screen   Specimen: Nasal Mucosa; Nasal Swab  Result Value Ref Range   MRSA, PCR NEGATIVE NEGATIVE   Staphylococcus aureus POSITIVE (A) NEGATIVE  Pregnancy, urine  Result Value Ref Range   Preg Test, Ur NEGATIVE NEGATIVE  VITAMIN D 25 Hydroxy (Vit-D Deficiency, Fractures)  Result Value Ref Range   Vit D, 25-Hydroxy 37.84 30 - 100 ng/mL  Iron and TIBC  Result Value Ref Range   Iron 14 (L) 28 - 170 ug/dL   TIBC 409 811 - 914 ug/dL   Saturation Ratios 5 (L) 10.4 - 31.8 %   UIBC 267 ug/dL  Calcium, ionized  Result Value Ref Range   Calcium, Ionized, Serum 4.7 4.5 - 5.6 mg/dL    Discharge Medications:   Allergies as of 08/22/2022       Reactions   Lactose Intolerance (gi) Other (See Comments)   Upset stomach        Medication List     STOP taking these medications    morphine 15 MG tablet Commonly known as: MSIR   Nexplanon 68 MG Impl implant Generic drug: etonogestrel   ondansetron 4 MG tablet Commonly known as: ZOFRAN  TAKE these medications    acetaminophen 325 MG tablet Commonly known as: TYLENOL Take 1-2 tablets (325-650 mg total) by mouth every 6 (six) hours as needed for mild pain (pain score 1-3 or temp > 100.5).   albuterol 108 (90 Base) MCG/ACT inhaler Commonly known as: VENTOLIN HFA Inhale 2 puffs into the lungs every 6 (six) hours as needed for wheezing.   aspirin 81 MG chewable tablet Chew 1 tablet (81 mg total) by mouth 2 (two) times daily.   busPIRone 10 MG tablet Commonly known as: BUSPAR Take 10 mg by mouth 2 (two) times daily.   celecoxib 100 MG capsule Commonly known as: CELEBREX Take 1 capsule (100 mg total) by mouth 2 (two) times daily.   docusate sodium 100 MG capsule Commonly known as: COLACE Take 1  capsule (100 mg total) by mouth 2 (two) times daily.   FLUoxetine 40 MG capsule Commonly known as: PROZAC Take 80 mg by mouth daily.   methocarbamol 500 MG tablet Commonly known as: ROBAXIN Take 1 tablet (500 mg total) by mouth every 6 (six) hours as needed for muscle spasms. What changed: when to take this   oxyCODONE 5 MG immediate release tablet Commonly known as: Oxy IR/ROXICODONE Take 1 tablet (5 mg total) by mouth every 4 (four) hours as needed for severe pain. What changed: when to take this   QUEtiapine 50 MG tablet Commonly known as: SEROQUEL Take 50 mg by mouth in the morning.   QUEtiapine 400 MG tablet Commonly known as: SEROQUEL Take 400 mg by mouth at bedtime.        Diagnostic Studies: XR Wrist 2 Views Right  Result Date: 08/27/2022 PA, lateral views of right wrist reviewed.  Distal radius fracture in excellent alignment with hardware that remains intact with no change compared with operative radiographs.  No interval fracture or dislocation.  XR KNEE 3 VIEW LEFT  Result Date: 08/27/2022 AP and lateral views of left knee reviewed.  Hardware remains intact with mild displacement of the anterior aspect of the fracture away from the articular surface.  There is a mild step-off of the articular surface.  No interval fracture or dislocation.  DG Knee Left Port  Result Date: 08/21/2022 CLINICAL DATA:  Post open reduction internal fixation of patella EXAM: PORTABLE LEFT KNEE - 1-2 VIEW COMPARISON:  08/19/2022 FINDINGS: Interval postoperative changes with screw fixation of a transverse patellar fracture. Mild residual cortical step-off with near anatomic position of the fracture fragments. Soft tissue gas anteriorly is consistent with recent surgery. IMPRESSION: Interval screw fixation of transverse patellar fracture. Electronically Signed   By: Burman Nieves M.D.   On: 08/21/2022 23:59   DG Knee 1-2 Views Left  Result Date: 08/21/2022 CLINICAL DATA:  ORIF patellar  fracture EXAM: LEFT KNEE - 1-2 VIEW COMPARISON:  08/19/2022 FINDINGS: Two fluoroscopic images are obtained during the performance of the procedure and are provided for interpretation only. Two cannulated screws traverse the transverse mid patellar fracture, with near anatomic alignment. Please refer to the operative report. FLUOROSCOPY TIME:  40 seconds, 2.89 mGy IMPRESSION: 1. ORIF distracted left patellar fracture, with near anatomic alignment. Electronically Signed   By: Sharlet Salina M.D.   On: 08/21/2022 20:20   DG Wrist Complete Right  Result Date: 08/21/2022 CLINICAL DATA:  ORIF EXAM: RIGHT WRIST - COMPLETE 3+ VIEW COMPARISON:  08/21/2022 FINDINGS: Plate and screw fixation device across the distal right radial fracture. Anatomic alignment. No hardware bony complicating feature. IMPRESSION: ORIF.  No visible  complicating feature. Electronically Signed   By: Charlett Nose M.D.   On: 08/21/2022 20:19   DG C-Arm 1-60 Min-No Report  Result Date: 08/21/2022 Fluoroscopy was utilized by the requesting physician.  No radiographic interpretation.   DG C-Arm 1-60 Min-No Report  Result Date: 08/21/2022 Fluoroscopy was utilized by the requesting physician.  No radiographic interpretation.   DG C-Arm 1-60 Min-No Report  Result Date: 08/21/2022 Fluoroscopy was utilized by the requesting physician.  No radiographic interpretation.   DG C-Arm 1-60 Min-No Report  Result Date: 08/21/2022 Fluoroscopy was utilized by the requesting physician.  No radiographic interpretation.   CT Wrist Right Wo Contrast  Result Date: 08/21/2022 CLINICAL DATA:  Right wrist fracture. EXAM: CT OF THE RIGHT WRIST WITHOUT CONTRAST TECHNIQUE: Multidetector CT imaging of the right wrist was performed according to the standard protocol. Multiplanar CT image reconstructions were also generated. RADIATION DOSE REDUCTION: This exam was performed according to the departmental dose-optimization program which includes automated exposure  control, adjustment of the mA and/or kV according to patient size and/or use of iterative reconstruction technique. COMPARISON:  Right wrist radiographs 08/21/2022 FINDINGS: Bones/Joint/Cartilage There is an acute, mildly to moderately comminuted, predominantly transverse fracture of the distal radial metaphysis as seen on prior radiographs. There is up to 4 mm lateral cortical step-off of the distal fracture component with respect to the proximal fracture component on coronal images (coronal series 7, image 22). There is approximately 20 degree volar apex angulation of the fracture. There is up to 5 mm dorsal displacement of the dominant distal fracture component with respect to the proximal fracture component. There is mild dorsal displacement of multiple dorsal cortical fragments (sagittal series 8 images 14 through 21). There is again a mildly displaced fracture of the base of the ulnar styloid as seen on prior radiographs. Ligaments Suboptimally assessed by CT. Muscles and Tendons No gross tendon tear is visualized, although the study is markedly limited for this evaluation. Soft tissues Moderate soft tissue swelling. IMPRESSION: 1. Acute, mildly to moderately comminuted, predominantly transverse acute to fracture of the distal radial metaphysis. With approximately 20 degree volar apex angulation and up to 5 mm dorsal displacement of the dominant distal fracture component. 2. Mildly displaced acute fracture of the base of the ulnar styloid. Electronically Signed   By: Neita Garnet M.D.   On: 08/21/2022 13:25   DG Wrist Complete Right  Result Date: 08/21/2022 CLINICAL DATA:  Fall.  Wrist pain. EXAM: RIGHT WRIST - COMPLETE 3+ VIEW COMPARISON:  None Available. FINDINGS: Comminuted fracture of the distal radial metaphysis noted with 7-8 mm posterior displacement of the distal fracture fragment and apex anterior angulation. No definite extension to the articular surface visible on the provided images.  Associated ulnar styloid fracture evident. IMPRESSION: Comminuted fracture of the distal radial metaphysis with 7-8 mm posterior displacement of the distal fracture fragment and apex anterior angulation. Ulnar styloid fracture. Electronically Signed   By: Kennith Center M.D.   On: 08/21/2022 10:02   DG Knee Complete 4 Views Left  Result Date: 08/19/2022 CLINICAL DATA:  Knee pain. The patient reports recent medial patellofemoral ligament repair. EXAM: LEFT KNEE - COMPLETE 4+ VIEW COMPARISON:  Knee radiographs dated 06/06/2022. FINDINGS: There is a displaced fracture of the patella. The primary two fracture fragments are displaced 3.2 cm vertically with rotation of the inferior fracture fragment. There is significant associated soft tissue swelling. There is no joint dislocation. IMPRESSION: Displaced fracture of the patella. Electronically Signed   By: Joselyn Glassman  Litton M.D.   On: 08/19/2022 19:57    Disposition: Discharge disposition: 01-Home or Self Care       Discharge Instructions     Call MD / Call 911   Complete by: As directed    If you experience chest pain or shortness of breath, CALL 911 and be transported to the hospital emergency room.  If you develope a fever above 101 F, pus (white drainage) or increased drainage or redness at the wound, or calf pain, call your surgeon's office.   Constipation Prevention   Complete by: As directed    Drink plenty of fluids.  Prune juice may be helpful.  You may use a stool softener, such as Colace (over the counter) 100 mg twice a day.  Use MiraLax (over the counter) for constipation as needed.   Diet - low sodium heart healthy   Complete by: As directed    Discharge instructions   Complete by: As directed    Okay to weight-bear as tolerated left leg with the knee in the knee immobilizer Okay to remove Ace wrap tomorrow.  Dressing underneath is waterproof Elevate hand and move fingers but do not do any lifting with the right arm No weightbearing  through the right arm.  When using a walker use the platform portion for the right arm   Increase activity slowly as tolerated   Complete by: As directed    Post-operative opioid taper instructions:   Complete by: As directed    POST-OPERATIVE OPIOID TAPER INSTRUCTIONS: It is important to wean off of your opioid medication as soon as possible. If you do not need pain medication after your surgery it is ok to stop day one. Opioids include: Codeine, Hydrocodone(Norco, Vicodin), Oxycodone(Percocet, oxycontin) and hydromorphone amongst others.  Long term and even short term use of opiods can cause: Increased pain response Dependence Constipation Depression Respiratory depression And more.  Withdrawal symptoms can include Flu like symptoms Nausea, vomiting And more Techniques to manage these symptoms Hydrate well Eat regular healthy meals Stay active Use relaxation techniques(deep breathing, meditating, yoga) Do Not substitute Alcohol to help with tapering If you have been on opioids for less than two weeks and do not have pain than it is ok to stop all together.  Plan to wean off of opioids This plan should start within one week post op of your joint replacement. Maintain the same interval or time between taking each dose and first decrease the dose.  Cut the total daily intake of opioids by one tablet each day Next start to increase the time between doses. The last dose that should be eliminated is the evening dose.           Follow-up Information     August Saucer, Corrie Mckusick, MD. Schedule an appointment as soon as possible for a visit.   Specialty: Orthopedic Surgery Contact information: [address] [place] [address] [phone]         [facility] Follow up.   Why: [employer] will be providing Platform RW to your hospital room before you are discharged home today. Contact information: [address] [place] [address] [phone]                   Signed: Karenann Cai 09/06/2022, 10:40 AM

## 2022-09-12 ENCOUNTER — Ambulatory Visit (INDEPENDENT_AMBULATORY_CARE_PROVIDER_SITE_OTHER): Payer: Medicaid Other | Admitting: Orthopedic Surgery

## 2022-09-12 ENCOUNTER — Other Ambulatory Visit (INDEPENDENT_AMBULATORY_CARE_PROVIDER_SITE_OTHER): Payer: Medicaid Other

## 2022-09-12 ENCOUNTER — Encounter: Payer: Self-pay | Admitting: Orthopedic Surgery

## 2022-09-12 DIAGNOSIS — M25562 Pain in left knee: Secondary | ICD-10-CM | POA: Diagnosis not present

## 2022-09-12 DIAGNOSIS — M25531 Pain in right wrist: Secondary | ICD-10-CM

## 2022-09-12 NOTE — Progress Notes (Signed)
Post-Op Visit Note   Patient: Janet Chen           Date of Birth: March 22, 1996           MRN: [id number] Visit Date: 09/12/2022 PCP: Virgilio Belling, PA-C   Assessment & Plan:  Chief Complaint:  Chief Complaint  Patient presents with   Left Knee - Routine Post Op   Right Wrist - Routine Post Op   Visit Diagnoses:  1. Pain in right wrist   2. Left knee pain, unspecified chronicity     Plan: Patient presents now about 3 weeks out right distal radius fracture fixation left patella fracture fixation.  She has been doing Psychologist, clinical well.  Been in a wrist splint for the wrist and a Bledsoe brace for the left knee.  On examination she does have full extension.  No real tenderness around the patella itself.  No calf is palpable.  Right wrist has about 50 degrees of flexion and 30 of extension.  Mild pain over the ulnar styloid which was fractured but minimally displaced.  Radiographs show no change in fracture alignment of the radius.  No change in slight gapping anteriorly of the patella fracture.  No callus formation noted in this region either.  Patella itself is very stable to lateral stress.  Plan is to continue Bledsoe brace for 2 more weeks continue wrist splint for 2 more weeks and then okay to come out of the splint for range of motion exercises.  Want her to come back in 2 to 3 weeks for clinical recheck and repeat radiographs on the knee.  Follow-Up Instructions: No follow-ups on file.   Orders:  Orders Placed This Encounter  Procedures   XR Knee 1-2 Views Left   XR Wrist 2 Views Right   No orders of the defined types were placed in this encounter.   Imaging: No results found.  PMFS History: Patient Active Problem List   Diagnosis Date Noted   Displaced transverse fracture of left patella, subsequent encounter for closed fracture with delayed healing 08/26/2022   Closed fracture of right wrist 08/26/2022   S/P knee surgery 08/21/2022   Meniscal cyst, left  07/29/2022   Patellar instability of left knee 07/29/2022   Past Medical History:  Diagnosis Date   Anxiety    Asthma    Bipolar disorder (HCC)    Depression    Ectopic pregnancy    Insomnia     No family history on file.  Past Surgical History:  Procedure Laterality Date   Fallopian Tube Removal     MEDIAL PATELLOFEMORAL LIGAMENT REPAIR Left 07/03/2022   Procedure: LEFT KNEE ARTHROSCOPY, CYST DECOMPRESSION, DEBRIDEMENT, MEDIAL PATELLOFEMORAL LIGAMENT RECONSTRUCTION WITH HAMSTRING ALLOGRAFT;  Surgeon: Cammy Copa, MD;  Location: [facility] OR;  Service: Orthopedics;  Laterality: Left;   OPEN REDUCTION INTERNAL FIXATION (ORIF) DISTAL RADIAL FRACTURE Right 08/21/2022   Procedure: OPEN REDUCTION INTERNAL FIXATION (ORIF) DISTAL RADIUS FRACTURE;  Surgeon: Cammy Copa, MD;  Location: [facility] OR;  Service: Orthopedics;  Laterality: Right;   ORIF PATELLA Left 08/21/2022   Procedure: OPEN REDUCTION INTERNAL FIXATION (ORIF) PATELLA;  Surgeon: Cammy Copa, MD;  Location: [facility] OR;  Service: Orthopedics;  Laterality: Left;   Social History   Occupational History   Not on file  Tobacco Use   Smoking status: Never   Smokeless tobacco: Never  Vaping Use   Vaping Use: Every day  Substance and Sexual Activity   Alcohol use: Not Currently  Drug use: Not Currently   Sexual activity: Not Currently

## 2022-10-03 ENCOUNTER — Ambulatory Visit: Payer: Medicaid Other | Admitting: Orthopedic Surgery

## 2022-10-03 ENCOUNTER — Other Ambulatory Visit (INDEPENDENT_AMBULATORY_CARE_PROVIDER_SITE_OTHER): Payer: Medicaid Other

## 2022-10-03 DIAGNOSIS — M25531 Pain in right wrist: Secondary | ICD-10-CM | POA: Diagnosis not present

## 2022-10-03 DIAGNOSIS — M25562 Pain in left knee: Secondary | ICD-10-CM

## 2022-10-03 NOTE — Progress Notes (Signed)
Post-Op Visit Note   Patient: Janet Chen           Date of Birth: 03-Jun-1995           MRN: [id number] Visit Date: 10/03/2022 PCP: Virgilio Belling, PA-C   Assessment & Plan:  Chief Complaint:  Chief Complaint  Patient presents with   Left Knee - Routine Post Op   Right Wrist - Follow-up   Visit Diagnoses:  1. Left knee pain, unspecified chronicity   2. Pain in right wrist     Plan: Patient is now about 6 weeks out right distal radius fracture open reduction internal fixation as well as left patella fracture open reduction internal fixation.  Patient is taking oral vitamin D daily.  Vitamin D was checked 1 month ago and it was 37.  She is doing [employer].  On examination she has no effusion in the knee and intact extensor mechanism.  Only mild tenderness around the mid lateral portion of the patella.  Has very good stability.  Right wrist has full pronation supination as well as likely only about 10 degrees of flexion extension compared to the left wrist.  Radiographs show no change in fracture alignment or position of the patella.  Appears to be slightly more lucency around the fracture site on the distal radius but no lucencies around the screws or pins distally.  Plan at this time his 5-week return with repeat radiographs of both the patella and the wrist.  She is having little bit more dull aching pain in the wrist compared to the patella.  Slight concern at this time for evolving nonunion which would be unusual.  Follow-up in 5 weeks.  Want her to really do range of motion exercises with the wrist but no lifting.  Follow-Up Instructions: No follow-ups on file.   Orders:  Orders Placed This Encounter  Procedures   XR KNEE 3 VIEW LEFT   XR Wrist 2 Views Right   No orders of the defined types were placed in this encounter.   Imaging: XR Wrist 2 Views Right  Result Date: 10/03/2022 AP lateral radiographs right wrist reviewed.  Right distal radius fracture fixation in  good position alignment.  There has been some slight widening of the fracture Compared to radiographs from 3 weeks ago.  No lucencies around the distal pegs or screws.  XR KNEE 3 VIEW LEFT  Result Date: 10/03/2022 AP lateral merchant radiographs left knee reviewed.  Femur and tibia intact.  Patella fracture unchanged in alignment position or mild displacement.  Some callus formation noted but it is not robust.   PMFS History: Patient Active Problem List   Diagnosis Date Noted   Displaced transverse fracture of left patella, subsequent encounter for closed fracture with delayed healing 08/26/2022   Closed fracture of right wrist 08/26/2022   S/P knee surgery 08/21/2022   Meniscal cyst, left 07/29/2022   Patellar instability of left knee 07/29/2022   Past Medical History:  Diagnosis Date   Anxiety    Asthma    Bipolar disorder (HCC)    Depression    Ectopic pregnancy    Insomnia     No family history on file.  Past Surgical History:  Procedure Laterality Date   Fallopian Tube Removal     MEDIAL PATELLOFEMORAL LIGAMENT REPAIR Left 07/03/2022   Procedure: LEFT KNEE ARTHROSCOPY, CYST DECOMPRESSION, DEBRIDEMENT, MEDIAL PATELLOFEMORAL LIGAMENT RECONSTRUCTION WITH HAMSTRING ALLOGRAFT;  Surgeon: Cammy Copa, MD;  Location: [facility] OR;  Service: Orthopedics;  Laterality: Left;   OPEN REDUCTION INTERNAL FIXATION (ORIF) DISTAL RADIAL FRACTURE Right 08/21/2022   Procedure: OPEN REDUCTION INTERNAL FIXATION (ORIF) DISTAL RADIUS FRACTURE;  Surgeon: Cammy Copa, MD;  Location: [facility] OR;  Service: Orthopedics;  Laterality: Right;   ORIF PATELLA Left 08/21/2022   Procedure: OPEN REDUCTION INTERNAL FIXATION (ORIF) PATELLA;  Surgeon: Cammy Copa, MD;  Location: [facility] OR;  Service: Orthopedics;  Laterality: Left;   Social History   Occupational History   Not on file  Tobacco Use   Smoking status: Never   Smokeless tobacco: Never  Vaping Use   Vaping Use: Every day  Substance and Sexual  Activity   Alcohol use: Not Currently   Drug use: Not Currently   Sexual activity: Not Currently

## 2022-10-04 ENCOUNTER — Telehealth: Payer: Self-pay | Admitting: Orthopedic Surgery

## 2022-10-04 NOTE — Telephone Encounter (Signed)
Called and advised.

## 2022-10-04 NOTE — Telephone Encounter (Signed)
Patient called. Would like to know if she could wear the patella brace? Her call back number is [phone]

## 2022-10-04 NOTE — Telephone Encounter (Signed)
Yes would be good to do that thx

## 2022-10-05 ENCOUNTER — Encounter: Payer: Self-pay | Admitting: Orthopedic Surgery

## 2022-11-07 ENCOUNTER — Encounter: Payer: Self-pay | Admitting: Orthopedic Surgery

## 2022-11-07 ENCOUNTER — Other Ambulatory Visit (INDEPENDENT_AMBULATORY_CARE_PROVIDER_SITE_OTHER): Payer: BC Managed Care – PPO

## 2022-11-07 ENCOUNTER — Ambulatory Visit (INDEPENDENT_AMBULATORY_CARE_PROVIDER_SITE_OTHER): Payer: Medicaid Other | Admitting: Orthopedic Surgery

## 2022-11-07 DIAGNOSIS — M25531 Pain in right wrist: Secondary | ICD-10-CM | POA: Diagnosis not present

## 2022-11-07 DIAGNOSIS — M25562 Pain in left knee: Secondary | ICD-10-CM | POA: Diagnosis not present

## 2022-11-07 NOTE — Progress Notes (Signed)
Post-Op Visit Note   Patient: Janet Chen           Date of Birth: November 15, 1995           MRN: [id number] Visit Date: 11/07/2022 PCP: Virgilio Belling, PA-C   Assessment & Plan:  Chief Complaint:  Chief Complaint  Patient presents with   Left Knee - Routine Post Op   Visit Diagnoses:  1. Pain in right wrist   2. Left knee pain, unspecified chronicity     Plan: Aleka is now about 4 months out left knee and right wrist open reduction internal fixation following a fall.  Overall she is doing well.  Still has some mild ulnar pain on the right-hand side.  Her wrist range of motion is excellent with pronation supination flexion and extension.  Grip strength is improving on the right versus left.  On the left knee she has full extension and flexes to about 110.  Not doing too much in the way of stairs but she does have excellent extension strength.  Radiographs of the wrist look good.  Radiographs of the knee unchanged compared to prior radiographs.  I think she does have a little bit more callus formation and no evidence of hardware loosening or lucencies today.  I would like to see her in about 3 months for final check.  Continue with current level activity.  Follow-Up Instructions: No follow-ups on file.   Orders:  Orders Placed This Encounter  Procedures   XR Knee 1-2 Views Left   XR Wrist 2 Views Right   No orders of the defined types were placed in this encounter.   Imaging: XR Knee 1-2 Views Left  Result Date: 11/07/2022 AP lateral radiographs left knee reviewed.  Patella fracture unchanged in position alignment compared to prior radiographs.  There has been some displacement of the fracture compared to initial postop radiographs; however, there is some evidence of callus formation between the major fracture fragments at this time compared to radiographs from 1 month ago.  XR Wrist 2 Views Right  Result Date: 11/07/2022 AP lateral radiographs right wrist  reviewed.  Plate fixation of distal radius fracture in good position alignment with no change.  Fracture line remains visible but less so compared to prior radiographs.  No lucencies around the pegs.   PMFS History: Patient Active Problem List   Diagnosis Date Noted   Displaced transverse fracture of left patella, subsequent encounter for closed fracture with delayed healing 08/26/2022   Closed fracture of right wrist 08/26/2022   S/P knee surgery 08/21/2022   Meniscal cyst, left 07/29/2022   Patellar instability of left knee 07/29/2022   Past Medical History:  Diagnosis Date   Anxiety    Asthma    Bipolar disorder (HCC)    Depression    Ectopic pregnancy    Insomnia     No family history on file.  Past Surgical History:  Procedure Laterality Date   Fallopian Tube Removal     MEDIAL PATELLOFEMORAL LIGAMENT REPAIR Left 07/03/2022   Procedure: LEFT KNEE ARTHROSCOPY, CYST DECOMPRESSION, DEBRIDEMENT, MEDIAL PATELLOFEMORAL LIGAMENT RECONSTRUCTION WITH HAMSTRING ALLOGRAFT;  Surgeon: Cammy Copa, MD;  Location: [facility] OR;  Service: Orthopedics;  Laterality: Left;   OPEN REDUCTION INTERNAL FIXATION (ORIF) DISTAL RADIAL FRACTURE Right 08/21/2022   Procedure: OPEN REDUCTION INTERNAL FIXATION (ORIF) DISTAL RADIUS FRACTURE;  Surgeon: Cammy Copa, MD;  Location: [facility] OR;  Service: Orthopedics;  Laterality: Right;   ORIF PATELLA Left 08/21/2022  Procedure: OPEN REDUCTION INTERNAL FIXATION (ORIF) PATELLA;  Surgeon: Cammy Copa, MD;  Location: [facility] OR;  Service: Orthopedics;  Laterality: Left;   Social History   Occupational History   Not on file  Tobacco Use   Smoking status: Never   Smokeless tobacco: Never  Vaping Use   Vaping Use: Every day  Substance and Sexual Activity   Alcohol use: Not Currently   Drug use: Not Currently   Sexual activity: Not Currently

## 2022-11-14 IMAGING — MR MR KNEE*L* W/O CM
6 series · 40 of 40 positions shown · non-contrast
Comparison: Left knee x-ray 05/13/2021

CLINICAL DATA: Left knee effusion and pain

EXAM:
MRI OF THE LEFT KNEE WITHOUT CONTRAST
TECHNIQUE: Multiplanar, multisequence MR imaging of the knee was performed. No
intravenous contrast was administered.

[Series 6: T2 fat-sat · axial · left · 4.0mm · 0.62mm/px · z∈[-98,+46]mm · 7 of 34 slices shown (1 of 3)]
[im 1/34]
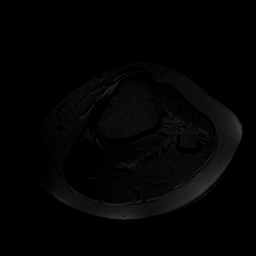
[im 6/34]
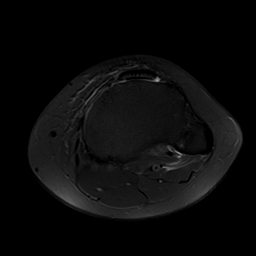
[im 12/34]
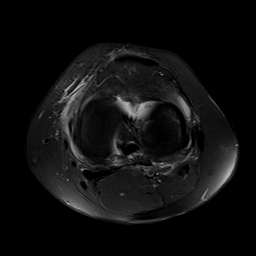
[im 17/34]
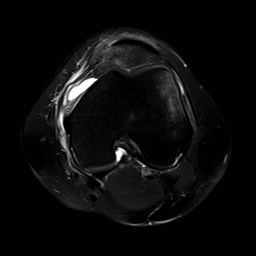
[im 23/34]
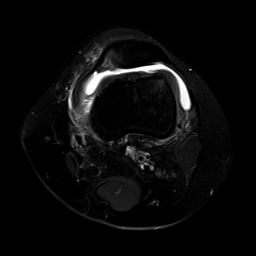
[im 28/34]
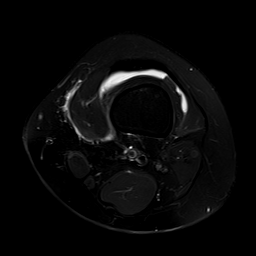
[im 34/34]
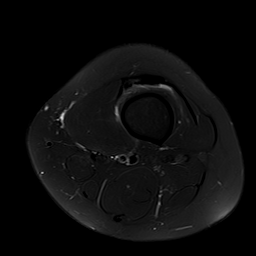

[Series 7: T2 fat-sat · coronal · left · 4.0mm · 0.47mm/px · 6 of 25 slices shown (2 of 3)]
[im 1/25]
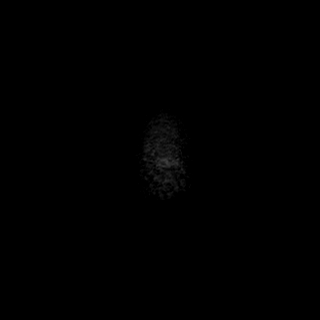
[im 5/25]
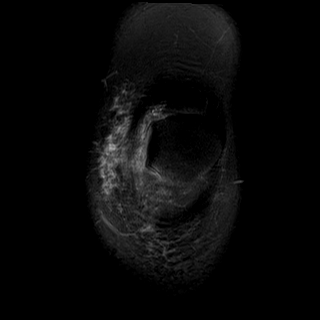
[im 10/25]
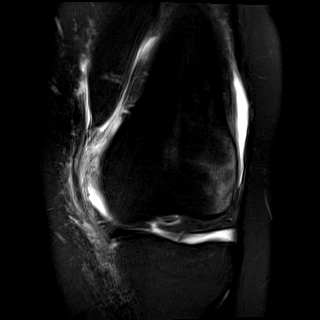
[im 15/25]
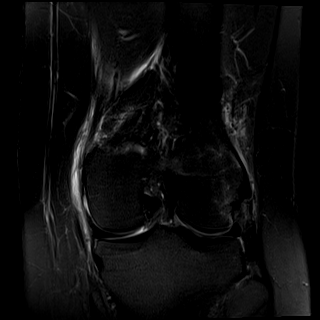
[im 20/25]
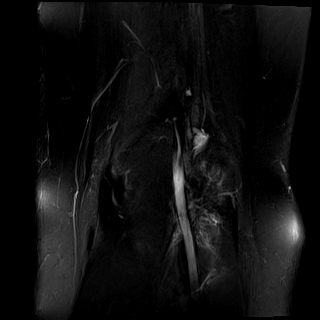
[im 25/25]
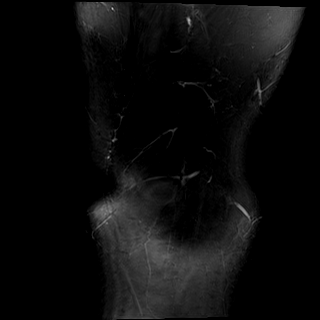

[Series 8: T1 · coronal · left · 4.0mm · 0.47mm/px · 6 of 25 slices shown]
[im 1/25]
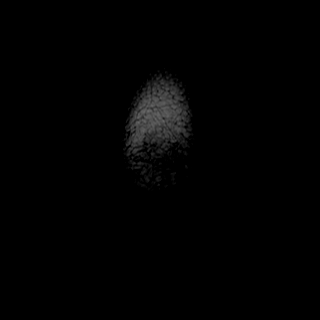
[im 5/25]
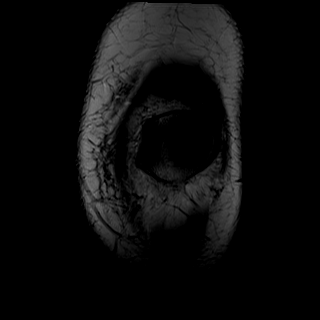
[im 10/25]
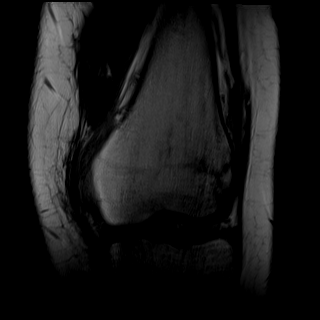
[im 15/25]
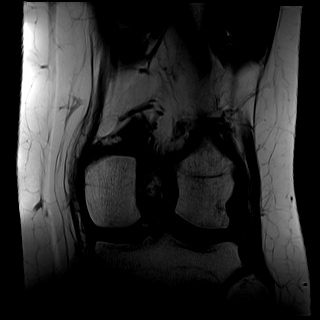
[im 20/25]
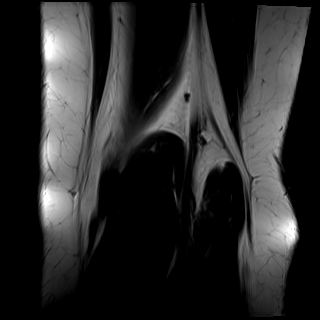
[im 25/25]
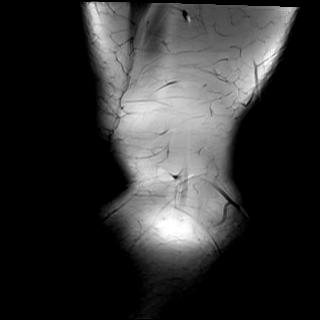

[Series 9: PD fat-sat · coronal · left · 3.0mm · 0.59mm/px · 7 of 30 slices shown (1 of 2)]
[im 1/30]
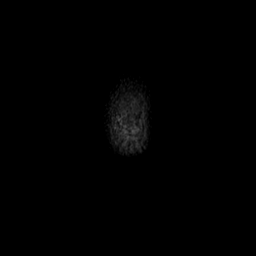
[im 5/30]
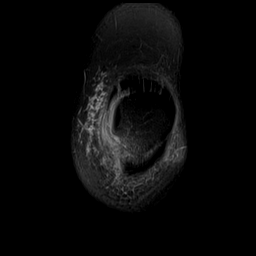
[im 10/30]
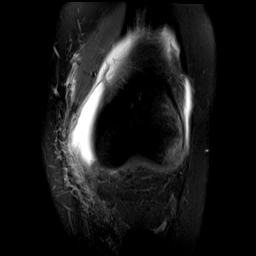
[im 15/30]
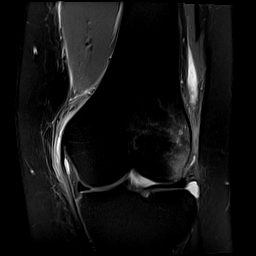
[im 20/30]
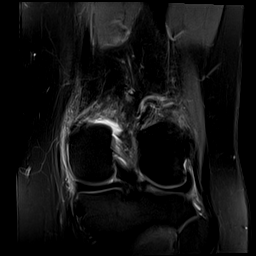
[im 25/30]
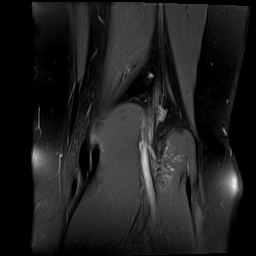
[im 30/30]
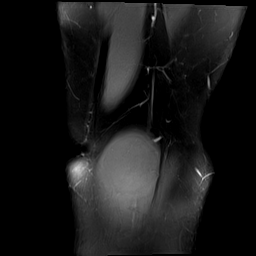

[Series 10: PD fat-sat · sagittal · left · 3.0mm · 0.47mm/px · 7 of 29 slices shown (2 of 2)]
[im 1/29]
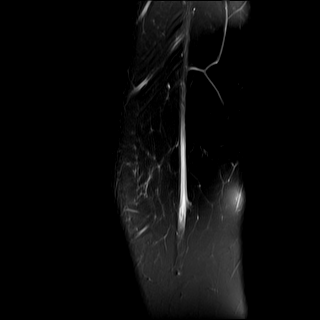
[im 5/29]
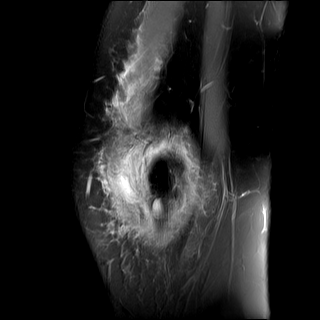
[im 10/29]
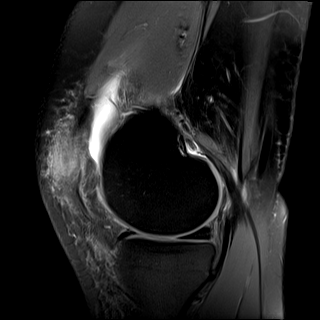
[im 15/29]
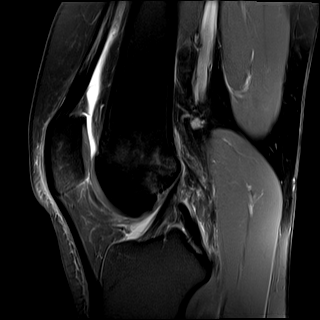
[im 19/29]
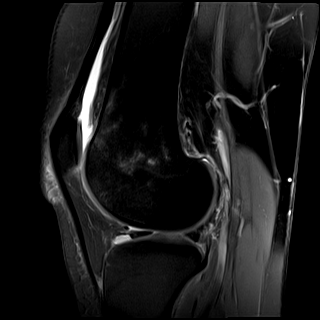
[im 24/29]
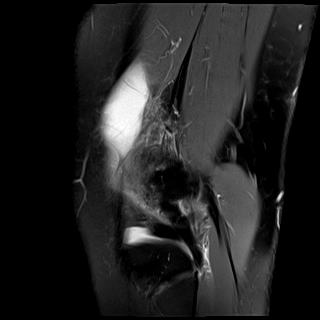
[im 29/29]
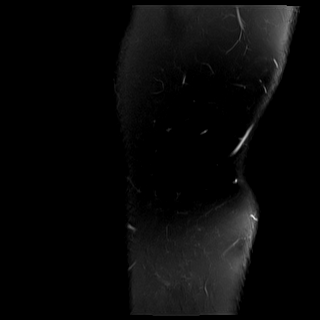

[Series 11: T2 fat-sat · sagittal · left · 3.0mm · 0.47mm/px · 7 of 29 slices shown (3 of 3)]
[im 1/29]
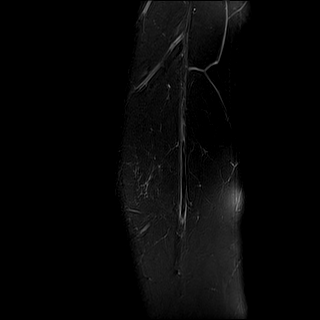
[im 5/29]
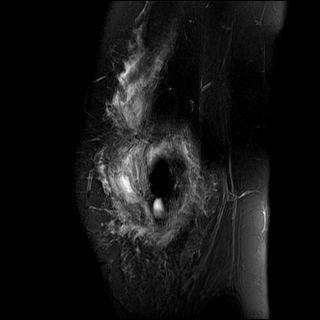
[im 10/29]
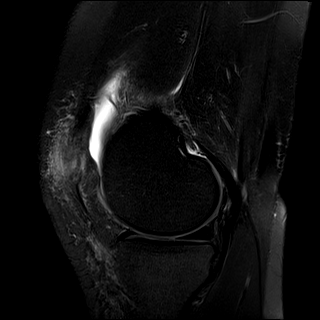
[im 15/29]
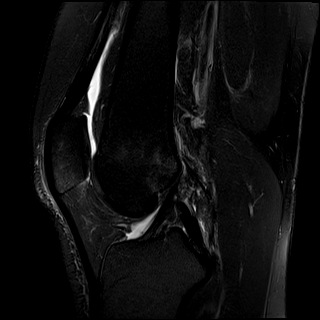
[im 19/29]
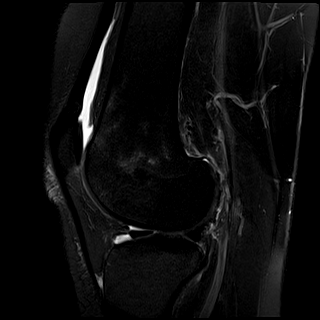
[im 24/29]
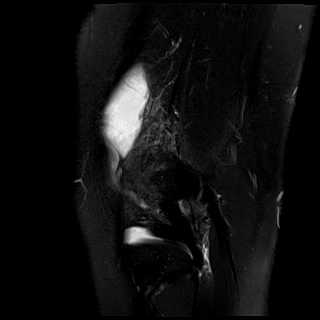
[im 29/29]
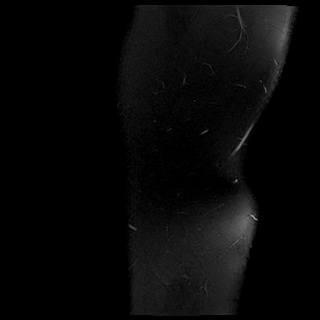

[40 of 40 positions shown; findings below may reference images not displayed]

FINDINGS: MENISCI

Medial meniscus:  Intact.

Lateral meniscus: Intact. There is well-defined parameniscal
fluid/cyst along the anterior aspect of the anterior horn lateral
meniscus which measures approximately 0.9 x 3 x 0.5 cm in AP,
transverse and craniocaudal dimensions, suspected communicating with
synovial fluid medially near the ACL.

LIGAMENTS

Cruciates:  Intact ACL and PCL.

Collaterals: Edema surrounding the MCL mild increased signal and
irregularity of the anterior fibers. Lateral collateral ligament
complex is intact.

CARTILAGE

Patellofemoral:  No chondral defect.

Medial:  No chondral defect.

Lateral:  No chondral defect.

Joint:  Moderate to large joint effusion.  Hoffa's fat is preserved.

Popliteal Fossa:  Trace popliteal cyst.  Popliteus tendon intact.

Extensor Mechanism:  Intact quadriceps tendon and patellar tendon.

Bones: Bone contusion at the medial pole of the patella and large
bone contusion at the lateral aspect of the lateral femoral condyle
with accompanying subtle cortical impaction.

Other: Edema throughout the medial patellofemoral ligament and
retinaculum with irregularity and partial tearing near the femoral
attachment and patellar attachment. Trochlear facet asymmetry with
small medial facet. TT-TG distance is within normal limits. Soft
tissue edema throughout the medial aspect of the knee.
IMPRESSION: 1. Evidence of recent transient lateral patellar dislocation with
associated bone contusions and subtle impaction fracture at the
lateral aspect of the lateral femoral condyle.
2. Associated medial patellofemoral ligament and retinaculum injury
including partial tearing near the patellar and femoral attachments.
3. Grade 2 sprain MCL.
4. Evidence of trochlear dysplasia with facet asymmetry.
5. Moderate to large joint effusion. Parameniscal fluid/cyst along
the anterior aspect of the anterior horn lateral meniscus, suspected
communication with synovial fluid medially. No meniscal tear
visualized.

## 2023-02-08 ENCOUNTER — Ambulatory Visit: Payer: Medicaid Other | Admitting: Orthopedic Surgery

## 2023-02-14 ENCOUNTER — Ambulatory Visit: Payer: Medicaid Other | Admitting: Orthopedic Surgery

## 2023-02-20 ENCOUNTER — Ambulatory Visit: Payer: BC Managed Care – PPO | Admitting: Orthopedic Surgery

## 2023-02-20 ENCOUNTER — Other Ambulatory Visit (INDEPENDENT_AMBULATORY_CARE_PROVIDER_SITE_OTHER): Payer: BC Managed Care – PPO

## 2023-02-20 ENCOUNTER — Other Ambulatory Visit (INDEPENDENT_AMBULATORY_CARE_PROVIDER_SITE_OTHER): Payer: Self-pay

## 2023-02-20 DIAGNOSIS — M25531 Pain in right wrist: Secondary | ICD-10-CM | POA: Diagnosis not present

## 2023-02-20 DIAGNOSIS — M25562 Pain in left knee: Secondary | ICD-10-CM

## 2023-02-20 NOTE — Progress Notes (Deleted)
Office Visit Note   Patient: Janet Chen           Date of Birth: 12-09-1995           MRN: [id number] Visit Date: 02/20/2023 Requested by: Virgilio Belling, PA-C [address] [place],  [place] [address] PCP: Chyrel Masson  Subjective: Chief Complaint  Patient presents with   Right Wrist - Follow-up   Left Knee - Follow-up    HPI: Janet Chen is a 27 y.o. female who presents to the office reporting ***.                ROS: All systems reviewed are negative as they relate to the chief complaint within the history of present illness.  Patient denies fevers or chills.  Assessment & Plan: Visit Diagnoses:  1. Pain in right wrist   2. Left knee pain, unspecified chronicity     Plan: ***  Follow-Up Instructions: No follow-ups on file.   Orders:  Orders Placed This Encounter  Procedures   XR Wrist 2 Views Right   XR KNEE 3 VIEW LEFT   No orders of the defined types were placed in this encounter.     Procedures: No procedures performed   Clinical Data: No additional findings.  Objective: Vital Signs: There were no vitals taken for this visit.  Physical Exam:  Constitutional: Patient appears well-developed HEENT:  Head: Normocephalic Eyes:EOM are normal Neck: Normal range of motion Cardiovascular: Normal rate Pulmonary/chest: Effort normal Neurologic: Patient is alert Skin: Skin is warm Psychiatric: Patient has normal mood and affect  Ortho Exam: ***  Specialty Comments:  No specialty comments available.  Imaging: XR Wrist 2 Views Right  Result Date: 02/20/2023 AP lateral oblique radiographs right wrist reviewed.  Hardware in good position alignment regarding distal radius fracture.  Nonunion of ulnar styloid fragment is noted which has not displaced compared to previous radiographs.  XR KNEE 3 VIEW LEFT  Result Date: 02/20/2023 AP lateral merchant radiographs left knee reviewed.  Patella fracture unchanged in  alignment.  No lucencies around the screws.  There has been bridging bone formation on the inferior aspect of the patella which is changed compared to radiographs from July.    PMFS History: Patient Active Problem List   Diagnosis Date Noted   Displaced transverse fracture of left patella, subsequent encounter for closed fracture with delayed healing 08/26/2022   Closed fracture of right wrist 08/26/2022   S/P knee surgery 08/21/2022   Meniscal cyst, left 07/29/2022   Patellar instability of left knee 07/29/2022   Past Medical History:  Diagnosis Date   Anxiety    Asthma    Bipolar disorder (HCC)    Depression    Ectopic pregnancy    Insomnia     No family history on file.  Past Surgical History:  Procedure Laterality Date   Fallopian Tube Removal     MEDIAL PATELLOFEMORAL LIGAMENT REPAIR Left 07/03/2022   Procedure: LEFT KNEE ARTHROSCOPY, CYST DECOMPRESSION, DEBRIDEMENT, MEDIAL PATELLOFEMORAL LIGAMENT RECONSTRUCTION WITH HAMSTRING ALLOGRAFT;  Surgeon: Cammy Copa, MD;  Location: [facility] OR;  Service: Orthopedics;  Laterality: Left;   OPEN REDUCTION INTERNAL FIXATION (ORIF) DISTAL RADIAL FRACTURE Right 08/21/2022   Procedure: OPEN REDUCTION INTERNAL FIXATION (ORIF) DISTAL RADIUS FRACTURE;  Surgeon: Cammy Copa, MD;  Location: [facility] OR;  Service: Orthopedics;  Laterality: Right;   ORIF PATELLA Left 08/21/2022   Procedure: OPEN REDUCTION INTERNAL FIXATION (ORIF) PATELLA;  Surgeon: Cammy Copa,  MD;  Location: [facility] OR;  Service: Orthopedics;  Laterality: Left;   Social History   Occupational History   Not on file  Tobacco Use   Smoking status: Never   Smokeless tobacco: Never  Vaping Use   Vaping status: Every Day  Substance and Sexual Activity   Alcohol use: Not Currently   Drug use: Not Currently   Sexual activity: Not Currently

## 2023-02-23 ENCOUNTER — Encounter: Payer: Self-pay | Admitting: Orthopedic Surgery

## 2023-02-23 NOTE — Progress Notes (Signed)
   Post-Op Visit Note   Patient: Janet Chen           Date of Birth: [birth year]           MRN: [id number] Visit Date: 02/20/2023 PCP: Virgilio Belling, PA-C   Assessment & Plan:  Chief Complaint:  Chief Complaint  Patient presents with   Right Wrist - Follow-up   Left Knee - Follow-up   Visit Diagnoses:  1. Pain in right wrist   2. Left knee pain, unspecified chronicity     Plan: Janet Chen is a 27 year old patient with left knee and right wrist pain.  She sustained a fall with fractures of each.  He was recently laid off from her job.  Does for her to write at times.  On exam she has good patellar stability and no effusion.  Radiographs on that left knee demonstrate some bony consolidation of that patella fracture at the joint line.  Still a little bit of gap anteriorly but that has not changed over 3 months.  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  I think this is what is giving her symptoms.  This would be a hand surgeon level intervention if she decides to get it fixed.  She will let me know if she wants to do that.  Follow-up with [place] as needed.  Follow-Up Instructions: No follow-ups on file.   Orders:  Orders Placed This Encounter  Procedures   XR Wrist 2 Views Right   XR KNEE 3 VIEW LEFT   No orders of the defined types were placed in this encounter.   Imaging: No results found.  PMFS History: Patient Active Problem List   Diagnosis Date Noted   Displaced transverse fracture of left patella, subsequent encounter for closed fracture with delayed healing 08/26/2022   Closed fracture of right wrist 08/26/2022   S/P knee surgery 08/21/2022   Meniscal cyst, left 07/29/2022   Patellar instability of left knee 07/29/2022   Past Medical History:  Diagnosis Date   Anxiety    Asthma    Bipolar disorder (HCC)    Depression    Ectopic pregnancy     Insomnia     No family history on file.  Past Surgical History:  Procedure Laterality Date   Fallopian Tube Removal     MEDIAL PATELLOFEMORAL LIGAMENT REPAIR Left 07/03/2022   Procedure: LEFT KNEE ARTHROSCOPY, CYST DECOMPRESSION, DEBRIDEMENT, MEDIAL PATELLOFEMORAL LIGAMENT RECONSTRUCTION WITH HAMSTRING ALLOGRAFT;  Surgeon: Cammy Copa, MD;  Location: [facility] OR;  Service: Orthopedics;  Laterality: Left;   OPEN REDUCTION INTERNAL FIXATION (ORIF) DISTAL RADIAL FRACTURE Right 08/21/2022   Procedure: OPEN REDUCTION INTERNAL FIXATION (ORIF) DISTAL RADIUS FRACTURE;  Surgeon: Cammy Copa, MD;  Location: [facility] OR;  Service: Orthopedics;  Laterality: Right;   ORIF PATELLA Left 08/21/2022   Procedure: OPEN REDUCTION INTERNAL FIXATION (ORIF) PATELLA;  Surgeon: Cammy Copa, MD;  Location: [facility] OR;  Service: Orthopedics;  Laterality: Left;   Social History   Occupational History   Not on file  Tobacco Use   Smoking status: Never   Smokeless tobacco: Never  Vaping Use   Vaping status: Every Day  Substance and Sexual Activity   Alcohol use: Not Currently   Drug use: Not Currently   Sexual activity: Not Currently

## 2023-04-01 ENCOUNTER — Telehealth: Payer: Self-pay | Admitting: Orthopaedic Surgery

## 2023-04-01 NOTE — Telephone Encounter (Signed)
We haven't seen patient for this. She is scheduled to come in next week. Please advise.

## 2023-04-01 NOTE — Telephone Encounter (Signed)
Patient called and wanted to know if she could get another boot for her right foot. The hospital didn't have a medium and its big. [phone]

## 2023-04-02 NOTE — Telephone Encounter (Signed)
Since we have never seen her before.  She will have to wait till next week unless she can find an earlier appointment with a PA

## 2023-04-03 NOTE — Telephone Encounter (Signed)
Called patient. No answer. LMOM that we cannot change boot until we have seen her for this problem.

## 2023-04-08 NOTE — Progress Notes (Unsigned)
Office Visit Note   Patient: Janet Chen           Date of Birth: 15-May-1995           MRN: [id number] Visit Date: 04/09/2023              Requested by: Virgilio Belling, PA-C [address] [place],  [place] [address] PCP: Virgilio Belling, PA-C   Assessment & Plan: Visit Diagnoses: No diagnosis found.  Plan: ***  Follow-Up Instructions: No follow-ups on file.   Orders:  No orders of the defined types were placed in this encounter.  No orders of the defined types were placed in this encounter.     Procedures: No procedures performed   Clinical Data: No additional findings.   Subjective: No chief complaint on file.   HPI  Review of Systems  Constitutional: Negative.   HENT: Negative.    Eyes: Negative.   Respiratory: Negative.    Cardiovascular: Negative.   Endocrine: Negative.   Musculoskeletal: Negative.   Neurological: Negative.   Hematological: Negative.   Psychiatric/Behavioral: Negative.    All other systems reviewed and are negative.   Objective: Vital Signs: There were no vitals taken for this visit.  Physical Exam Vitals and nursing note reviewed.  Constitutional:      Appearance: She is well-developed.  HENT:     Head: Normocephalic and atraumatic.  Pulmonary:     Effort: Pulmonary effort is normal.  Abdominal:     Palpations: Abdomen is soft.  Musculoskeletal:     Cervical back: Neck supple.  Skin:    General: Skin is warm.     Capillary Refill: Capillary refill takes less than 2 seconds.  Neurological:     Mental Status: She is alert and oriented to person, place, and time.  Psychiatric:        Behavior: Behavior normal.        Thought Content: Thought content normal.        Judgment: Judgment normal.   Ortho Exam  Specialty Comments:  No specialty comments available.  Imaging: No results found.   PMFS History: Patient Active Problem List   Diagnosis Date Noted  . Displaced transverse fracture  of left patella, subsequent encounter for closed fracture with delayed healing 08/26/2022  . Closed fracture of right wrist 08/26/2022  . S/P knee surgery 08/21/2022  . Meniscal cyst, left 07/29/2022  . Patellar instability of left knee 07/29/2022   Past Medical History:  Diagnosis Date  . Anxiety   . Asthma   . Bipolar disorder (HCC)   . Depression   . Ectopic pregnancy   . Insomnia     No family history on file.  Past Surgical History:  Procedure Laterality Date  . Fallopian Tube Removal    . MEDIAL PATELLOFEMORAL LIGAMENT REPAIR Left 07/03/2022   Procedure: LEFT KNEE ARTHROSCOPY, CYST DECOMPRESSION, DEBRIDEMENT, MEDIAL PATELLOFEMORAL LIGAMENT RECONSTRUCTION WITH HAMSTRING ALLOGRAFT;  Surgeon: Cammy Copa, MD;  Location: [facility] OR;  Service: Orthopedics;  Laterality: Left;  . OPEN REDUCTION INTERNAL FIXATION (ORIF) DISTAL RADIAL FRACTURE Right 08/21/2022   Procedure: OPEN REDUCTION INTERNAL FIXATION (ORIF) DISTAL RADIUS FRACTURE;  Surgeon: Cammy Copa, MD;  Location: [facility] OR;  Service: Orthopedics;  Laterality: Right;  . ORIF PATELLA Left 08/21/2022   Procedure: OPEN REDUCTION INTERNAL FIXATION (ORIF) PATELLA;  Surgeon: Cammy Copa, MD;  Location: [facility] OR;  Service: Orthopedics;  Laterality: Left;   Social History   Occupational History  .  Not on file  Tobacco Use  . Smoking status: Never  . Smokeless tobacco: Never  Vaping Use  . Vaping status: Every Day  Substance and Sexual Activity  . Alcohol use: Not Currently  . Drug use: Not Currently  . Sexual activity: Not Currently

## 2023-04-09 ENCOUNTER — Encounter: Payer: Self-pay | Admitting: Orthopaedic Surgery

## 2023-04-09 ENCOUNTER — Other Ambulatory Visit (INDEPENDENT_AMBULATORY_CARE_PROVIDER_SITE_OTHER): Payer: BC Managed Care – PPO

## 2023-04-09 ENCOUNTER — Ambulatory Visit (INDEPENDENT_AMBULATORY_CARE_PROVIDER_SITE_OTHER): Payer: BC Managed Care – PPO | Admitting: Orthopaedic Surgery

## 2023-04-09 DIAGNOSIS — M79671 Pain in right foot: Secondary | ICD-10-CM

## 2023-04-30 ENCOUNTER — Ambulatory Visit: Payer: BC Managed Care – PPO | Admitting: Physician Assistant

## 2023-05-07 ENCOUNTER — Telehealth (HOSPITAL_COMMUNITY): Payer: Self-pay | Admitting: *Deleted

## 2023-05-07 ENCOUNTER — Ambulatory Visit: Payer: BC Managed Care – PPO | Admitting: Physician Assistant

## 2023-05-07 NOTE — Telephone Encounter (Signed)
 Writer spoke with pt who called asking for an emergency refill of Seroquel . Pt says that her psychiatrist will not refill due to outstanding bill. Pt was advised that she may go to [facility] walk in clinic and was advised to present by 0700. Location provided. Pt verbalizes understanding.

## 2023-05-09 ENCOUNTER — Ambulatory Visit (INDEPENDENT_AMBULATORY_CARE_PROVIDER_SITE_OTHER): Payer: BC Managed Care – PPO | Admitting: Physician Assistant

## 2023-05-09 ENCOUNTER — Other Ambulatory Visit (INDEPENDENT_AMBULATORY_CARE_PROVIDER_SITE_OTHER): Payer: Self-pay

## 2023-05-09 ENCOUNTER — Encounter: Payer: Self-pay | Admitting: Physician Assistant

## 2023-05-09 DIAGNOSIS — M79671 Pain in right foot: Secondary | ICD-10-CM

## 2023-05-09 NOTE — Progress Notes (Signed)
 Office Visit Note   Patient: Janet Chen           Date of Birth: [birth year]           MRN: [id number] Visit Date: 05/09/2023              Requested by: Tammy Tari DASEN, PA-C [address] [place],  [place] [address] PCP: Tammy Tari DASEN, PA-C   Assessment & Plan: Visit Diagnoses:  1. Pain in right foot     Plan: Impression is right foot distal fifth metatarsal fracture.  Patient is clinically and radiographically improving although the fracture has not completely consolidated.  I recommending continuation of a stiff inserts versus a carbon fiber insert.  Follow-up in 3 weeks for repeat evaluation and three-view x-rays of the right foot.  Call with concerns or questions.  Follow-Up Instructions: Return in about 3 weeks (around 05/30/2023).   Orders:  Orders Placed This Encounter  Procedures   XR Foot Complete Right   No orders of the defined types were placed in this encounter.     Procedures: No procedures performed   Clinical Data: No additional findings.   Subjective: Chief Complaint  Patient presents with   Right Foot - Follow-up    HPI patient is a pleasant 28 year old female who comes in today approximately 5 weeks status post right foot distal fifth metatarsal fracture, date of injury 03/31/2023.  She has been doing much better over the past week.  She is wearing slippers today but tells me she has been wearing a shoe with a stiff insert.  Not taking anything for pain.     Objective: Vital Signs: There were no vitals taken for this visit.    Ortho Exam right foot exam: No swelling.  Mild tenderness to the fracture site.  She is neurovascularly intact distally.  Specialty Comments:  No specialty comments available.  Imaging: XR Foot Complete Right Result Date: 05/09/2023 X-rays demonstrate consolidation of fracture site.    PMFS History: Patient Active Problem List   Diagnosis Date Noted   Displaced transverse fracture of  left patella, subsequent encounter for closed fracture with delayed healing 08/26/2022   Closed fracture of right wrist 08/26/2022   S/P knee surgery 08/21/2022   Meniscal cyst, left 07/29/2022   Patellar instability of left knee 07/29/2022   Past Medical History:  Diagnosis Date   Anxiety    Asthma    Bipolar disorder (HCC)    Depression    Ectopic pregnancy    Insomnia     No family history on file.  Past Surgical History:  Procedure Laterality Date   Fallopian Tube Removal     MEDIAL PATELLOFEMORAL LIGAMENT REPAIR Left 07/03/2022   Procedure: LEFT KNEE ARTHROSCOPY, CYST DECOMPRESSION, DEBRIDEMENT, MEDIAL PATELLOFEMORAL LIGAMENT RECONSTRUCTION WITH HAMSTRING ALLOGRAFT;  Surgeon: Addie Cordella Hamilton, MD;  Location: [facility] OR;  Service: Orthopedics;  Laterality: Left;   OPEN REDUCTION INTERNAL FIXATION (ORIF) DISTAL RADIAL FRACTURE Right 08/21/2022   Procedure: OPEN REDUCTION INTERNAL FIXATION (ORIF) DISTAL RADIUS FRACTURE;  Surgeon: Addie Cordella Hamilton, MD;  Location: [facility] OR;  Service: Orthopedics;  Laterality: Right;   ORIF PATELLA Left 08/21/2022   Procedure: OPEN REDUCTION INTERNAL FIXATION (ORIF) PATELLA;  Surgeon: Addie Cordella Hamilton, MD;  Location: [facility] OR;  Service: Orthopedics;  Laterality: Left;   Social History   Occupational History   Not on file  Tobacco Use   Smoking status: Never   Smokeless tobacco: Never  Vaping Use  Vaping status: Every Day  Substance and Sexual Activity   Alcohol use: Not Currently   Drug use: Not Currently   Sexual activity: Not Currently

## 2023-05-22 ENCOUNTER — Ambulatory Visit (HOSPITAL_COMMUNITY): Payer: BC Managed Care – PPO

## 2023-05-30 ENCOUNTER — Ambulatory Visit: Payer: BC Managed Care – PPO | Admitting: Physician Assistant

## 2023-07-01 ENCOUNTER — Ambulatory Visit: Payer: BC Managed Care – PPO | Admitting: Physician Assistant

## 2023-08-29 ENCOUNTER — Encounter: Payer: Self-pay | Admitting: Orthopaedic Surgery

## 2023-08-29 ENCOUNTER — Other Ambulatory Visit (INDEPENDENT_AMBULATORY_CARE_PROVIDER_SITE_OTHER): Payer: Self-pay

## 2023-08-29 ENCOUNTER — Ambulatory Visit: Admitting: Orthopaedic Surgery

## 2023-08-29 DIAGNOSIS — G8929 Other chronic pain: Secondary | ICD-10-CM

## 2023-08-29 DIAGNOSIS — M545 Low back pain, unspecified: Secondary | ICD-10-CM | POA: Diagnosis not present

## 2023-08-29 DIAGNOSIS — M25562 Pain in left knee: Secondary | ICD-10-CM | POA: Diagnosis not present

## 2023-08-29 NOTE — Progress Notes (Signed)
 Office Visit Note   Patient: Janet Chen           Date of Birth: 1995-09-12           MRN: [id number] Visit Date: 08/29/2023              Requested by: Virl Grimes, PA-C [address] [place],  [place] [address] PCP: Virl Grimes, PA-C   Assessment & Plan: Visit Diagnoses:  1. Chronic pain of left knee   2. Chronic midline low back pain without sciatica     Plan: Assessment and Plan    Knee calcification post-surgery Prominent calcification on lateral patella, asymptomatic unless bumped. Full ROM, flexion >130 degrees. Reassurance not a screw, patella healed. - Discussed outpatient procedure option for calcification removal if symptomatic.  Chronic low back pain Chronic pain with compressed vertebra, no radicular symptoms. X-rays show intact lumbar vertebrae, preserved intervertebral spaces, mild scoliosis. - Recommend physical therapy for pain management.        Follow-Up Instructions: No follow-ups on file.   Orders:  Orders Placed This Encounter  Procedures   XR KNEE 3 VIEW LEFT   XR Lumbar Spine 2-3 Views   No orders of the defined types were placed in this encounter.   Subjective: Chief Complaint  Patient presents with   Left Knee - Pain    HPI Discussed the use of AI scribe software for clinical note transcription with the patient, who gave verbal consent to proceed.  History of Present Illness   Janet Chen is a 28 year old female who presents with concerns about a protrusion in her knee following previous left patella surgery 1 year ago.  She underwent kneecap surgery approximately a year ago and is now concerned about a protrusion on the lateral side of her knee, resembling a screw. There is no pain unless the area is bumped, which causes discomfort. She is cautious when kneeling due to perceived weakness but does not experience significant limitations in flexibility or function.   Her work in a bar involves  extensive walking and movement, raising concerns about the knee's impact on her job. She has been monitoring the situation and has a picture of her x-ray on her phone for reference.   Also concerned back chronic back pain.  Was told she had "compression" in her spine and scoliosis.  Denies radicular symptoms or previous trauma.      Review of Systems  Constitutional: Negative.   HENT: Negative.    Eyes: Negative.   Respiratory: Negative.    Cardiovascular: Negative.   Endocrine: Negative.   Musculoskeletal: Negative.   Neurological: Negative.   Hematological: Negative.   Psychiatric/Behavioral: Negative.    All other systems reviewed and are negative.    Objective: Vital Signs: There were no vitals taken for this visit.  Physical Exam Vitals and nursing note reviewed.  Constitutional:      Appearance: She is well-developed.  HENT:     Head: Atraumatic.     Nose: Nose normal.  Eyes:     Extraocular Movements: Extraocular movements intact.  Cardiovascular:     Pulses: Normal pulses.  Pulmonary:     Effort: Pulmonary effort is normal.  Abdominal:     Palpations: Abdomen is soft.  Musculoskeletal:     Cervical back: Neck supple.  Skin:    General: Skin is warm.     Capillary Refill: Capillary refill takes less than 2 seconds.  Neurological:  Mental Status: She is alert. Mental status is at baseline.  Psychiatric:        Behavior: Behavior normal.        Thought Content: Thought content normal.        Judgment: Judgment normal.     Ortho Exam Physical Exam   MUSCULOSKELETAL:  Left knee - Prominent firm area on lateral patella. Scar fully healed on knee. Full knee extension and flexion to 90 degrees.   Lumbar spine - nontender - no abnormalities      Specialty Comments:  No specialty comments available.  Imaging: XR KNEE 3 VIEW LEFT Result Date: 08/29/2023 X-rays of the left knee show prior ORIF patella with 2 vertical screws.  Osteophytic  calcification noted on the lateral aspect of the patella.  XR Lumbar Spine 2-3 Views Result Date: 08/29/2023 No acute or structural abnormalities.    PMFS History: Patient Active Problem List   Diagnosis Date Noted   Displaced transverse fracture of left patella, subsequent encounter for closed fracture with delayed healing 08/26/2022   Closed fracture of right wrist 08/26/2022   S/P knee surgery 08/21/2022   Meniscal cyst, left 07/29/2022   Patellar instability of left knee 07/29/2022   Past Medical History:  Diagnosis Date   Anxiety    Asthma    Bipolar disorder (HCC)    Depression    Ectopic pregnancy    Insomnia     No family history on file.  Past Surgical History:  Procedure Laterality Date   Fallopian Tube Removal     MEDIAL PATELLOFEMORAL LIGAMENT REPAIR Left 07/03/2022   Procedure: LEFT KNEE ARTHROSCOPY, CYST DECOMPRESSION, DEBRIDEMENT, MEDIAL PATELLOFEMORAL LIGAMENT RECONSTRUCTION WITH HAMSTRING ALLOGRAFT;  Surgeon: Jasmine Mesi, MD;  Location: [facility] OR;  Service: Orthopedics;  Laterality: Left;   OPEN REDUCTION INTERNAL FIXATION (ORIF) DISTAL RADIAL FRACTURE Right 08/21/2022   Procedure: OPEN REDUCTION INTERNAL FIXATION (ORIF) DISTAL RADIUS FRACTURE;  Surgeon: Jasmine Mesi, MD;  Location: [facility] OR;  Service: Orthopedics;  Laterality: Right;   ORIF PATELLA Left 08/21/2022   Procedure: OPEN REDUCTION INTERNAL FIXATION (ORIF) PATELLA;  Surgeon: Jasmine Mesi, MD;  Location: [facility] OR;  Service: Orthopedics;  Laterality: Left;   Social History   Occupational History   Not on file  Tobacco Use   Smoking status: Never   Smokeless tobacco: Never  Vaping Use   Vaping status: Every Day  Substance and Sexual Activity   Alcohol use: Not Currently   Drug use: Not Currently   Sexual activity: Not Currently

## 2023-09-09 ENCOUNTER — Ambulatory Visit: Admitting: Orthopedic Surgery

## 2023-09-16 ENCOUNTER — Ambulatory Visit: Admitting: Orthopedic Surgery

## 2024-03-02 ENCOUNTER — Encounter: Payer: Self-pay | Admitting: Radiology
